# Patient Record
Sex: Male | Born: 1965
Health system: Southern US, Community
[De-identification: ages and names within clinical notes are randomized; demographics above are authoritative.]

## PROBLEM LIST (undated history)

## (undated) DIAGNOSIS — L570 Actinic keratosis: Secondary | ICD-10-CM

## (undated) DIAGNOSIS — I10 Essential (primary) hypertension: Secondary | ICD-10-CM

## (undated) DIAGNOSIS — M109 Gout, unspecified: Secondary | ICD-10-CM

## (undated) HISTORY — DX: Essential (primary) hypertension: I10

## (undated) HISTORY — DX: Gout, unspecified: M10.9

## (undated) HISTORY — DX: Actinic keratosis: L57.0

---

## 2014-12-01 ENCOUNTER — Emergency Department: Payer: 59

## 2014-12-01 ENCOUNTER — Emergency Department
Admission: EM | Admit: 2014-12-01 | Discharge: 2014-12-01 | Disposition: A | Discharge status: Home / Self Care | Payer: 59 | Attending: Emergency Medicine | Admitting: Emergency Medicine

## 2014-12-01 DIAGNOSIS — M79605 Pain in left leg: Secondary | ICD-10-CM

## 2014-12-01 DIAGNOSIS — Z791 Long term (current) use of non-steroidal anti-inflammatories (NSAID): Secondary | ICD-10-CM | POA: Diagnosis not present

## 2014-12-01 LAB — COMPREHENSIVE METABOLIC PANEL
ALBUMIN: 4.3 g/dL (ref 3.5–5.0)
ALT: 45 U/L (ref 17–63)
AST: 23 U/L (ref 15–41)
Alkaline Phosphatase: 28 U/L — ABNORMAL LOW (ref 38–126)
Anion gap: 7 (ref 5–15)
BUN: 12 mg/dL (ref 6–20)
CALCIUM: 9.5 mg/dL (ref 8.9–10.3)
CO2: 27 mmol/L (ref 22–32)
CREATININE: 1.1 mg/dL (ref 0.61–1.24)
Chloride: 103 mmol/L (ref 101–111)
GFR calc non Af Amer: >60 mL/min (ref >60)
Glucose, Bld: 139 mg/dL — ABNORMAL HIGH (ref 65–99)
Potassium: 4 mmol/L (ref 3.5–5.1)
SODIUM: 137 mmol/L (ref 135–145)
Total Bilirubin: 0.6 mg/dL (ref 0.3–1.2)
Total Protein: 7.3 g/dL (ref 6.5–8.1)

## 2014-12-01 LAB — CBC
HEMATOCRIT: 44.5 % (ref 40.0–52.0)
Hemoglobin: 14.7 g/dL (ref 13.0–18.0)
MCH: 28.7 pg (ref 26.0–34.0)
MCHC: 32.9 g/dL (ref 32.0–36.0)
MCV: 87.2 fL (ref 80.0–100.0)
PLATELETS: 225 10*3/uL (ref 150–440)
RBC: 5.11 MIL/uL (ref 4.40–5.90)
RDW: 14.7 % — ABNORMAL HIGH (ref 11.5–14.5)
WBC: 7.2 10*3/uL (ref 3.8–10.6)

## 2014-12-01 MED ORDER — NAPROXEN 500 MG PO TABS: 500.0000 mg | ORAL_TABLET | Freq: Two times a day (BID) | ORAL | Status: DC

## 2014-12-01 MED ORDER — HYDROCODONE-ACETAMINOPHEN 5-325 MG PO TABS: 1.0000 | ORAL_TABLET | ORAL | Status: DC | PRN

## 2014-12-01 NOTE — Discharge Instructions (Signed)
Moist heat to leg and elevate if any swelling.  Return to ER if any severe worsening or urgent concerns

## 2014-12-01 NOTE — ED Provider Notes (Signed)
[facility] Emergency Department Provider Note  ____________________________________________  Time seen: 1326  I have reviewed the triage vital signs and the nursing notes.   HISTORY  Chief Complaint Leg Pain   HPI Alan English is a 49 y.o. male is here today for evaluation of left calf pain. He states that he called his PCP at [facility] in [place] and was told to go to the emergency room for evaluation of a blood clot. On Saturday he noticed some pain after returning from [place]. He states that several times during the flight he did get up and walk. He has never had a history of blood clots in the past. Yesterday he slept most of the day but this morning and noticed that the pain was still there. He has not taken any medication over-the-counter for this. He describes his pain as a 1 or 2 out of 10 and more like a "charley horse". He denies any shortness of breath, chest pain, fever or chills. There is been no redness or warmth   No past medical history on file.  There are no active problems to display for this patient.   No past surgical history on file.  Current Outpatient Rx  Name  Route  Sig  Dispense  Refill  . HYDROcodone-acetaminophen (NORCO/VICODIN) 5-325 MG per tablet   Oral   Take 1 tablet by mouth every 4 (four) hours as needed for moderate pain.   20 tablet   0   . naproxen (NAPROSYN) 500 MG tablet   Oral   Take 1 tablet (500 mg total) by mouth 2 (two) times daily with a meal.   30 tablet   0     Allergies Review of patient's allergies indicates no known allergies.  No family history on file.  Social History History  Substance Use Topics  . Smoking status: Not on file  . Smokeless tobacco: Not on file  . Alcohol Use: Not on file    Review of Systems Constitutional: No fever/chills Eyes: No visual changes. ENT: No sore throat. Cardiovascular: Denies chest pain. Respiratory: Denies shortness of breath. Gastrointestinal:  No abdominal pain.  No nausea, no vomiting.  Musculoskeletal: Negative for back pain. Skin: Negative for rash. No redness or warmth left leg. Neurological: Negative for headaches, focal weakness or numbness. 10-point ROS otherwise negative.  ____________________________________________   PHYSICAL EXAM:  VITAL SIGNS: ED Triage Vitals  Enc Vitals Group     BP 12/01/14 1129 191/96 mmHg     Pulse Rate 12/01/14 1129 70     Resp 12/01/14 1129 20     Temp 12/01/14 1129 97.5 F (36.4 C)     Temp Source 12/01/14 1129 Oral     SpO2 12/01/14 1129 100 %     Weight 12/01/14 1129 300 lb (136.079 kg)     Height 12/01/14 1129 6\' 1"  (1.854 m)     Head Cir --      Peak Flow --      Pain Score 12/01/14 1129 2     Pain Loc --      Pain Edu? --      Excl. in [place]? --     Constitutional: Alert and oriented. Well appearing and in no acute distress. Eyes: Conjunctivae are normal. PERRL. EOMI. Head: Atraumatic. Nose: No congestion/rhinnorhea. Neck: No stridor.   Hematological/Lymphatic/Immunilogical: No cervical lymphadenopathy. Cardiovascular: Normal rate, regular rhythm. Grossly normal heart sounds.  Good peripheral circulation. Respiratory: Normal respiratory effort.  No retractions. Lungs CTAB.  Gastrointestinal: Soft and nontender. No distention. No abdominal bruits. Musculoskeletal: Left lower extremity tenderness without edema.  No joint effusions. No pedal edema noted.  Skin is warm, no redness, skin is intact. Homans sign is negative. Pulses are intact. Neurologic:  Normal speech and language. No gross focal neurologic deficits are appreciated. Speech is normal. No gait instability. Skin:  Skin is warm, dry and intact. No rash noted. Psychiatric: Mood and affect are normal. Speech and behavior are normal.  ____________________________________________   LABS (all labs ordered are listed, but only abnormal results are displayed)  Labs Reviewed  COMPREHENSIVE METABOLIC PANEL - Abnormal;  Notable for the following:    Glucose, Bld 139 (*)    Alkaline Phosphatase 28 (*)    All other components within normal limits  CBC - Abnormal; Notable for the following:    RDW 14.7 (*)    All other components within normal limits   ____________________________________________  EKG  Deferred ____________________________________________  RADIOLOGY  No evidence of DVT per radiologist on ultrasound. ____________________________________________   PROCEDURES  Procedure(s) performed: None  Critical Care performed: No  ____________________________________________   INITIAL IMPRESSION / ASSESSMENT AND PLAN / ED COURSE  Pertinent labs & imaging results that were available during my care of the patient were reviewed by me and considered in my medical decision making (see chart for details).  Patient was reassured that this was not a blood clot in his left leg. He was prescribed naproxen for inflammation and Norco as needed for pain. He is to use warm compresses to the area and elevate if needed for swelling. He is also going to follow up with his primary care physician if any continued problems. He is return to the emergency room if any severe worsening or urgent concerns. ____________________________________________   FINAL CLINICAL IMPRESSION(S) / ED DIAGNOSES  Final diagnoses:  Acute leg pain, left      Johnn Hai, PA-C 12/01/14 [place], MD 12/02/14 [phone]

## 2014-12-01 NOTE — ED Notes (Signed)
Patient recently returned from [place] on last Saturday and began having left lower leg pain.  Denies swelling or redness to left leg.  PCP sent for evaluation of possible blood clot.  Denies SHOB or CP.

## 2015-09-29 IMAGING — US US EXTREM LOW VENOUS*L*
1 series · 13 of 24 positions shown · non-contrast
Comparison: None.

CLINICAL DATA: Acute left lower extremity pain.



[Series 1: us extrem low venous*left* · 0.12mm/px · 13 of 44 slices shown]
[im 1/44]
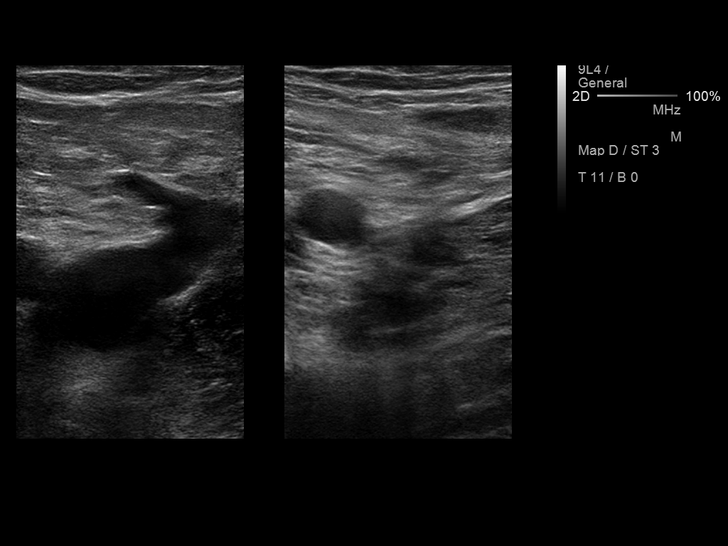
[im 4/44]
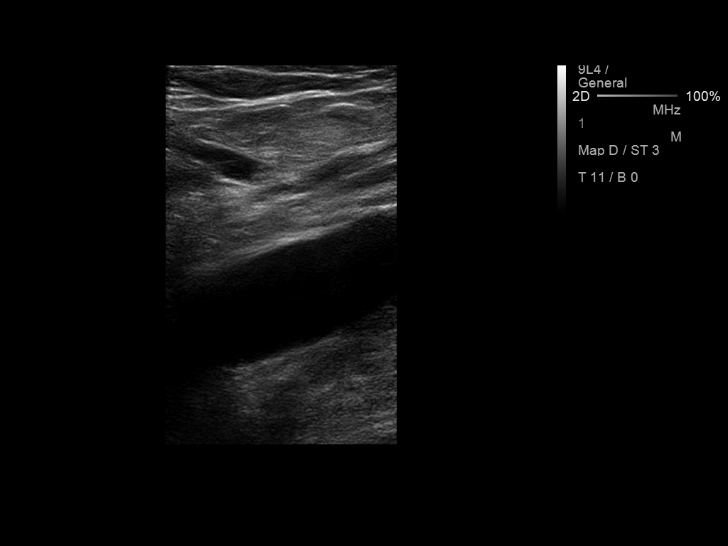
[im 8/44]
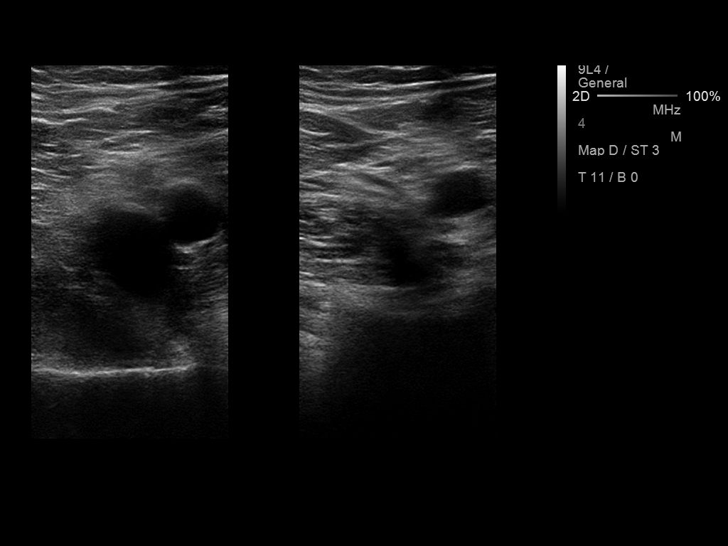
[im 12/44]
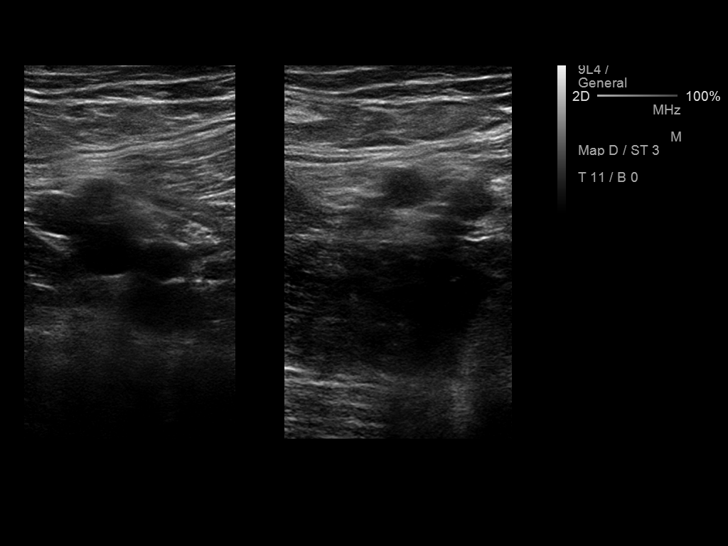
[im 15/44]
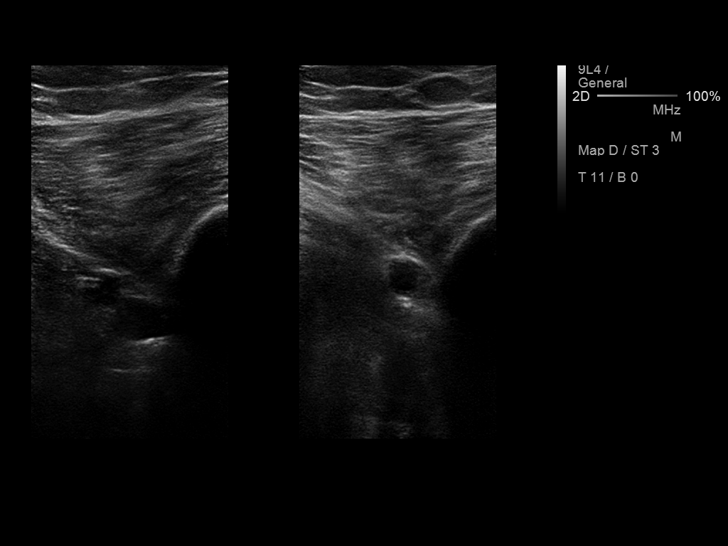
[im 19/44]
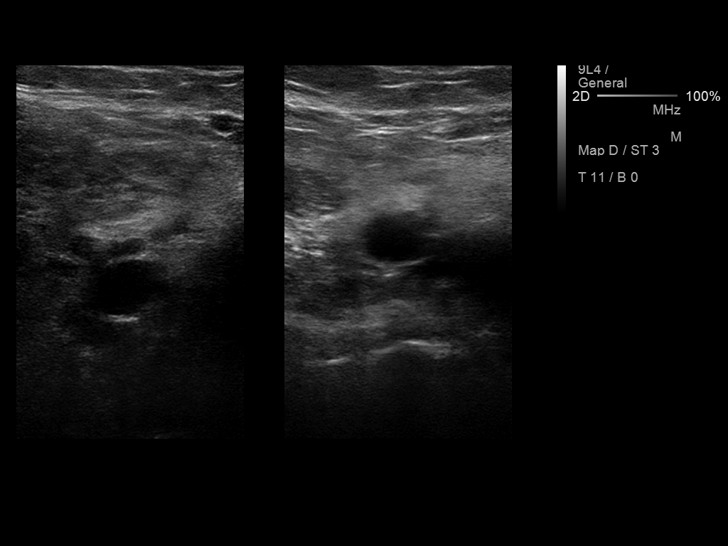
[im 23/44]
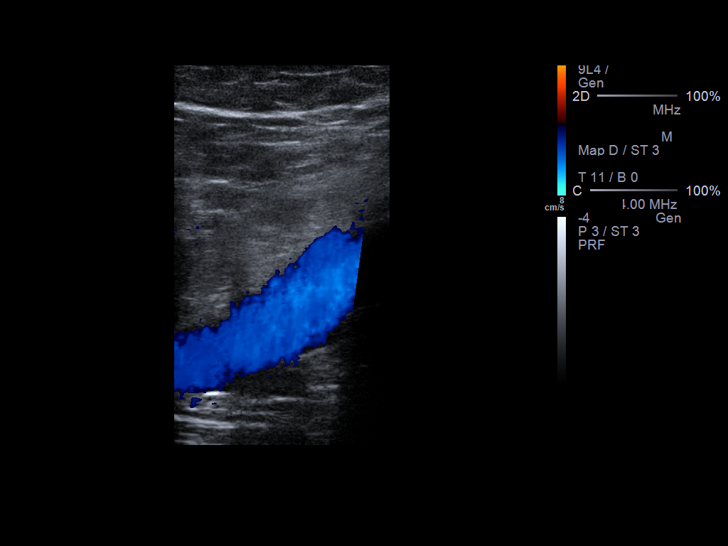
[im 25/44]
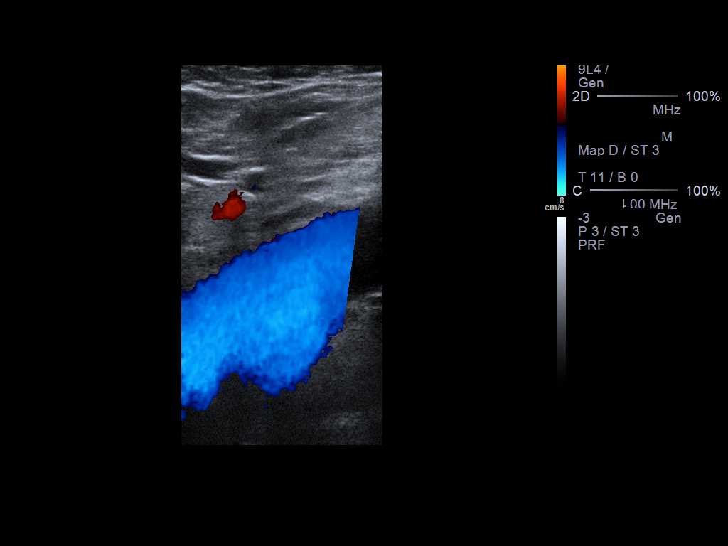
[im 29/44]
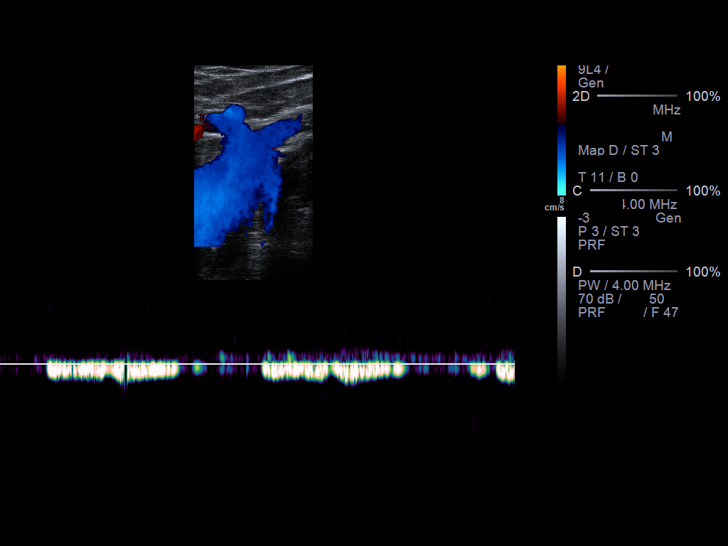
[im 32/44]
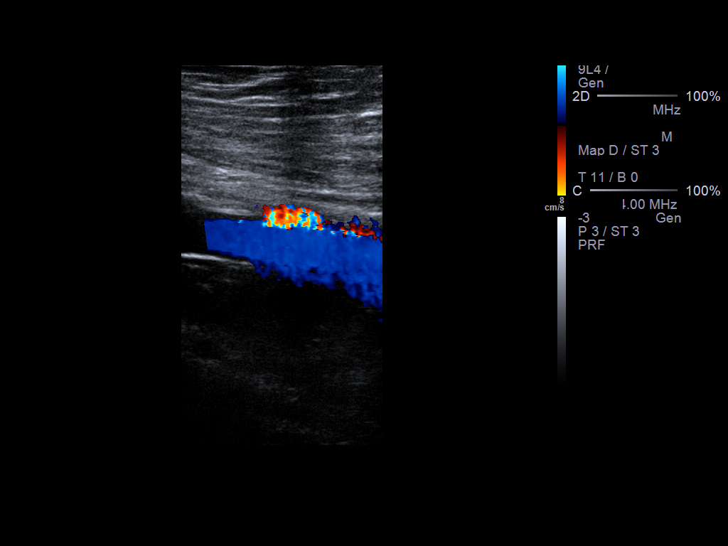
[im 36/44]
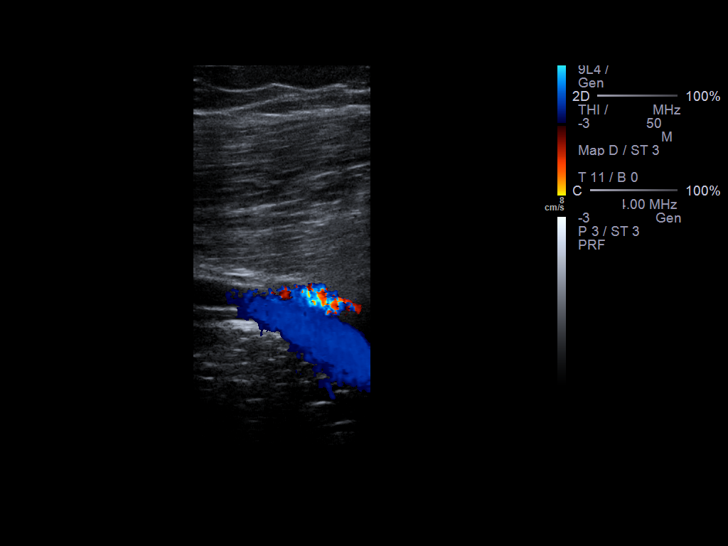
[im 40/44]
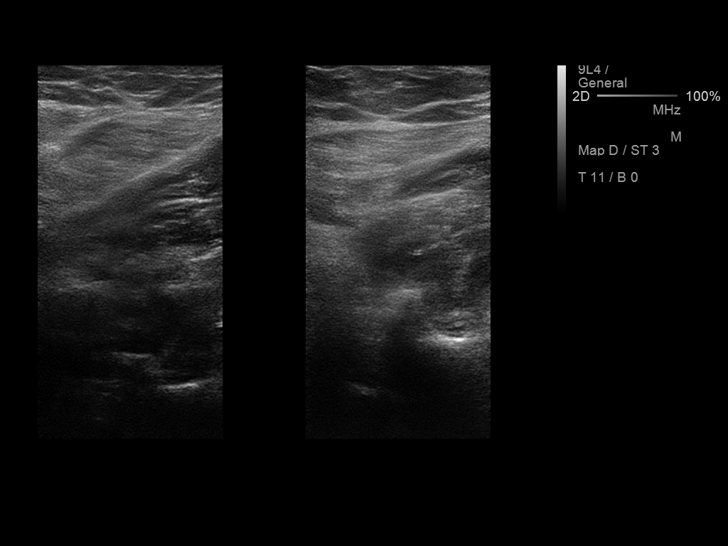
[im 44/44]
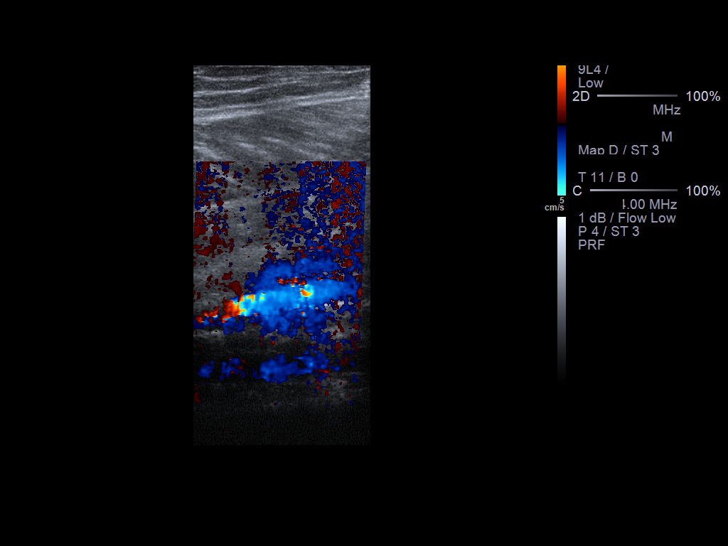

[13 of 24 positions shown; findings below may reference images not displayed]

FINDINGS: Contralateral Common Femoral Vein: Respiratory phasicity is normal
and symmetric with the symptomatic side. No evidence of thrombus.
Normal compressibility.

Common Femoral Vein: No evidence of thrombus. Normal
compressibility, respiratory phasicity and response to augmentation.

Saphenofemoral Junction: No evidence of thrombus. Normal
compressibility and flow on color Doppler imaging.

Profunda Femoral Vein: No evidence of thrombus. Normal
compressibility and flow on color Doppler imaging.

Femoral Vein: No evidence of thrombus. Normal compressibility,
respiratory phasicity and response to augmentation.

Popliteal Vein: No evidence of thrombus. Normal compressibility,
respiratory phasicity and response to augmentation.

Calf Veins: No evidence of thrombus. Normal compressibility and flow
on color Doppler imaging.

Superficial Great Saphenous Vein: No evidence of thrombus. Normal
compressibility and flow on color Doppler imaging.

Venous Reflux:  None.

Other Findings:  None.
IMPRESSION: No evidence of deep venous thrombosis seen in left lower extremity.

## 2019-12-03 DIAGNOSIS — S62634A Displaced fracture of distal phalanx of right ring finger, initial encounter for closed fracture: Secondary | ICD-10-CM | POA: Diagnosis not present

## 2019-12-03 DIAGNOSIS — S67192A Crushing injury of right middle finger, initial encounter: Secondary | ICD-10-CM | POA: Diagnosis not present

## 2019-12-03 DIAGNOSIS — M20011 Mallet finger of right finger(s): Secondary | ICD-10-CM | POA: Diagnosis not present

## 2021-01-28 ENCOUNTER — Ambulatory Visit: Payer: 59 | Admitting: Family Medicine

## 2021-02-01 ENCOUNTER — Other Ambulatory Visit: Payer: Self-pay | Admitting: Family Medicine

## 2021-02-01 ENCOUNTER — Ambulatory Visit (INDEPENDENT_AMBULATORY_CARE_PROVIDER_SITE_OTHER): Payer: BC Managed Care – PPO | Admitting: Family Medicine

## 2021-02-01 ENCOUNTER — Encounter: Payer: Self-pay | Admitting: Family Medicine

## 2021-02-01 ENCOUNTER — Other Ambulatory Visit: Payer: Self-pay

## 2021-02-01 VITALS — BP 162/94 | HR 78 | Ht 73.0 in | Wt 319.8 lb

## 2021-02-01 DIAGNOSIS — R7309 Other abnormal glucose: Secondary | ICD-10-CM

## 2021-02-01 DIAGNOSIS — M1A9XX0 Chronic gout, unspecified, without tophus (tophi): Secondary | ICD-10-CM

## 2021-02-01 DIAGNOSIS — Z0000 Encounter for general adult medical examination without abnormal findings: Secondary | ICD-10-CM

## 2021-02-01 DIAGNOSIS — Z8619 Personal history of other infectious and parasitic diseases: Secondary | ICD-10-CM

## 2021-02-01 DIAGNOSIS — I10 Essential (primary) hypertension: Secondary | ICD-10-CM

## 2021-02-01 DIAGNOSIS — Z6841 Body Mass Index (BMI) 40.0 and over, adult: Secondary | ICD-10-CM

## 2021-02-01 DIAGNOSIS — Z7689 Persons encountering health services in other specified circumstances: Secondary | ICD-10-CM

## 2021-02-01 DIAGNOSIS — Z1159 Encounter for screening for other viral diseases: Secondary | ICD-10-CM

## 2021-02-01 DIAGNOSIS — Z125 Encounter for screening for malignant neoplasm of prostate: Secondary | ICD-10-CM

## 2021-02-01 DIAGNOSIS — R011 Cardiac murmur, unspecified: Secondary | ICD-10-CM

## 2021-02-01 MED ORDER — COLCHICINE 0.6 MG PO TABS: ORAL_TABLET | ORAL | 1 refills | Status: DC

## 2021-02-01 MED ORDER — AMLODIPINE BESYLATE 10 MG PO TABS: 10.0000 mg | ORAL_TABLET | Freq: Every day | ORAL | 1 refills | Status: DC

## 2021-02-01 NOTE — Patient Instructions (Addendum)
Thank you for coming to the office today.  Start Amlodipine 48m daily. Caution mild swelling of ankle/foot, can elevate if need.  Start Colchicine PRN flare if needed as discussed.  Omron (brand) arm automatic BP cuff. Keep a log. Goal < 140/90.  Colon Cancer Screening: - For all adults age 55+routine colon cancer screening is highly recommended.     - Recent guidelines from [place] starting age of 421- Early detection of colon cancer is important, because often there are no warning signs or symptoms, also if found early usually it can be cured. Late stage is hard to treat.  - If you are not interested in Colonoscopy screening (if done and normal you could be cleared for 5 to 10 years until next due), then Cologuard is an excellent alternative for screening test for Colon Cancer. It is highly sensitive for detecting DNA of colon cancer from even the earliest stages. Also, there is NO bowel prep required. - If Cologuard is NEGATIVE, then it is good for 3 years before next due - If Cologuard is POSITIVE, then it is strongly advised to get a Colonoscopy, which allows the GI doctor to locate the source of the cancer or polyp (even very early stage) and treat it by removing it. ------------------------- If you would like to proceed with Cologuard (stool DNA test) - FIRST, call your insurance company and tell them you want to check cost of Cologuard tell them CPT Code [phone](it may be completely covered and you could get for no cost, OR max cost without any coverage is about $600). Also, keep in mind if you do NOT open the kit, and decide not to do the test, you will NOT be charged, you should contact the company if you decide not to do the test. - If you want to proceed, you can notify [place](phone message, [place] or at next visit) and we will order it for you. The test kit will be delivered to you house within about 1 week. Follow instructions to collect sample, you  may call the company for any help or questions, 24/7 telephone support at [phone]   ---------------------------  DUE for FASTING BLOOD WORK (no food or drink after midnight before the lab appointment, only water or coffee without cream/sugar on the morning of)  SCHEDULE "Lab Only" visit in the morning at the clinic for lab draw in [place]  - Make sure Lab Only appointment is at about 1 week before your next appointment, so that results will be available  For Lab Results, once available within 2-3 days of blood draw, you can can log in to MyChart online to view your results and a brief explanation. Also, we can discuss results at next follow-up visit.    Please schedule a Follow-up Appointment to: Return in about 4 weeks (around 03/01/2021) for 4 weeks fasting lab only then 1 week later Annual Physical.  If you have any other questions or concerns, please feel free to call the office or send a message through [place] [place] You may also schedule an earlier appointment if necessary.  Additionally, you may be receiving a survey about your experience at our office within a few days to 1 week by e-mail or mail. We value your feedback.  ANobie Putnam DO [facility]

## 2021-02-01 NOTE — Telephone Encounter (Signed)
   Notes to clinic:  Patient requests 90 days supply  Requested Prescriptions  Pending Prescriptions Disp Refills   colchicine 0.6 MG tablet [Pharmacy Med Name: COLCHICINE 0.6MG  TABLETS] 90 tablet     Sig: TAKE 1 TABLET BY MOUTH AS DIRECTED, MAY REPEAT DOSE ON DAY 1, THEN TAKE 1 TABLET DAILY FOR UP TO 7 TO 10 DAYS AS NEEDED FOR GOUT FLARE UNTIL RESOLVED      Endocrinology:  Gout Agents Failed - 02/01/2021 11:24 AM      Failed - Uric Acid in normal range and within 360 days    No results found for: POCURA, LABURIC        Failed - Cr in normal range and within 360 days    Creatinine, Ser  Date Value Ref Range Status  12/01/2014 1.10 0.61 - 1.24 mg/dL Final          Passed - Valid encounter within last 12 months    Recent Outpatient Visits           Today Chronic gout involving toe of right foot without tophus, unspecified cause   [place], DO       Future Appointments             In 1 month [facility], [facility]

## 2021-02-01 NOTE — Progress Notes (Signed)
Subjective:    Patient ID: Alan English, male    DOB: April 03, 1966, 55 y.o.   MRN: [id number]  Alan English is a 55 y.o. male presenting on 02/01/2021 for Establish Care and Hypertension  No prior PCP >10 years.  HPI  CHRONIC HTN: Reports recent 1 month ago he attempted to donate blood and he had elevated blood pressure and they asked him to see PCP. He saw Dentist several weeks ago and had elevated. - He does not have a BP cuff at home. Current Meds - None, never on med   Lifestyle: - Diet: Goal to improve - Exercise: Goal to improve Denies CP, dyspnea, HA, edema, dizziness / lightheadedness  Gout Chronic problem Previously on Allopurinol, limited success. He had same number of flares and frequency in past while on med. He asks about PRN medication for flare. Often 4 times a year approximately, R big toe / ankle. Due for uric acid in future. Check on lab. He was not sure what dose on Allopurinol, thinks it was 100mg  only.  Health Maintenance:  Colon CA Screening: Never had colonoscopy or screening. Currently asymptomatic. No known family history of colon CA. Due for screening test considering options. Interested in colonscopy  COVID Vaccine  1st 2 doses, and Booster #1  Unsure if he has chicken pox. Will inquire.   Depression screen [facility] 2/9 02/01/2021  Decreased Interest 0  Down, Depressed, Hopeless 0  PHQ - 2 Score 0  Altered sleeping 0  Tired, decreased energy 0  Change in appetite 0  Feeling bad or failure about yourself  0  Trouble concentrating 0  Moving slowly or fidgety/restless 0  Suicidal thoughts 0  PHQ-9 Score 0  Difficult doing work/chores Not difficult at all   GAD 7 : Generalized Anxiety Score 02/01/2021  Nervous, Anxious, on Edge 0  Control/stop worrying 0  Worry too much - different things 0  Trouble relaxing 0  Restless 0  Easily annoyed or irritable 0  Afraid - awful might happen 0  Total GAD 7 Score 0  Anxiety Difficulty Not difficult at  all      Past Medical History:  Diagnosis Date   Gout    Hypertension    History reviewed. No pertinent surgical history. Social History   Socioeconomic History   Marital status: Married    Spouse name: Not on file   Number of children: Not on file   Years of education: Not on file   Highest education level: Not on file  Occupational History   Not on file  Tobacco Use   Smoking status: Never   Smokeless tobacco: Never  Vaping Use   Vaping Use: Never used  Substance and Sexual Activity   Alcohol use: Yes    Alcohol/week: 1.0 standard drink    Types: 1 Standard drinks or equivalent per week   Drug use: Not on file   Sexual activity: Not on file  Other Topics Concern   Not on file  Social History Narrative   Not on file   Social Determinants of Health   Financial Resource Strain: Not on file  Food Insecurity: Not on file  Transportation Needs: Not on file  Physical Activity: Not on file  Stress: Not on file  Social Connections: Not on file  Intimate Partner Violence: Not on file   Family History  Problem Relation Age of Onset   Heart attack Maternal Grandfather 57   Heart attack Paternal Grandfather 51   Current Outpatient Medications  on File Prior to Visit  Medication Sig   multivitamin-iron-minerals-folic acid (CENTRUM) chewable tablet Chew 1 tablet by mouth daily.   No current facility-administered medications on file prior to visit.    Review of Systems Per HPI unless specifically indicated above     Objective:    BP (!) 162/94 (BP Location: Left Arm, Cuff Size: Large)   Pulse 78   Ht 6\' 1"  (1.854 m)   Wt (!) 319 lb 12.8 oz (145.1 kg)   SpO2 100%   BMI 42.19 kg/m   Wt Readings from Last 3 Encounters:  02/01/21 (!) 319 lb 12.8 oz (145.1 kg)  12/01/14 300 lb (136.1 kg)    Physical Exam Vitals and nursing note reviewed.  Constitutional:      General: He is not in acute distress.    Appearance: Normal appearance. He is well-developed. He is  not diaphoretic.     Comments: Well-appearing, comfortable, cooperative  HENT:     Head: Normocephalic and atraumatic.  Eyes:     General:        Right eye: No discharge.        Left eye: No discharge.     Conjunctiva/sclera: Conjunctivae normal.  Cardiovascular:     Rate and Rhythm: Normal rate and regular rhythm.     Pulses: Normal pulses.     Heart sounds: Murmur (mild 1/6 systolic) heard.  Pulmonary:     Effort: Pulmonary effort is normal.  Skin:    General: Skin is warm and dry.     Findings: No erythema or rash.  Neurological:     Mental Status: He is alert and oriented to person, place, and time.  Psychiatric:        Mood and Affect: Mood normal.        Behavior: Behavior normal.        Thought Content: Thought content normal.     Comments: Well groomed, good eye contact, normal speech and thoughts      Results for orders placed or performed during the hospital encounter of 12/01/14  Comprehensive metabolic panel  Result Value Ref Range   Sodium 137 135 - 145 mmol/L   Potassium 4.0 3.5 - 5.1 mmol/L   Chloride 103 101 - 111 mmol/L   CO2 27 22 - 32 mmol/L   Glucose, Bld 139 (H) 65 - 99 mg/dL   BUN 12 6 - 20 mg/dL   Creatinine, Ser 1.10 0.61 - 1.24 mg/dL   Calcium 9.5 8.9 - 10.3 mg/dL   Total Protein 7.3 6.5 - 8.1 g/dL   Albumin 4.3 3.5 - 5.0 g/dL   AST 23 15 - 41 U/L   ALT 45 17 - 63 U/L   Alkaline Phosphatase 28 (L) 38 - 126 U/L   Total Bilirubin 0.6 0.3 - 1.2 mg/dL   GFR calc non Af Amer >60 >60 mL/min   GFR calc Af Amer >60 >60 mL/min   Anion gap 7 5 - 15  CBC  Result Value Ref Range   WBC 7.2 3.8 - 10.6 K/uL   RBC 5.11 4.40 - 5.90 MIL/uL   Hemoglobin 14.7 13.0 - 18.0 g/dL   HCT 44.5 40.0 - 52.0 %   MCV 87.2 80.0 - 100.0 fL   MCH 28.7 26.0 - 34.0 pg   MCHC 32.9 32.0 - 36.0 g/dL   RDW 14.7 (H) 11.5 - 14.5 %   Platelets 225 150 - 440 K/uL      Assessment & Plan:  Problem List Items Addressed This Visit     Morbid obesity with BMI of 40.0-44.9,  adult ([place])   Heart murmur   Essential hypertension    Elevated BP, repeat check improved manually - Home BP readings none  No known complications   Plan:  1. START Amlodipine 10mg  daily - counsel on potential risk side effect benefits 2. Encourage improved lifestyle - low sodium diet, regular exercise 3. Start monitor BP outside office, bring readings to next visit, if persistently >140/90 or new symptoms notify office sooner  F/u 4 weeks labs       Relevant Medications   amLODipine (NORVASC) 10 MG tablet   Chronic gout involving toe of right foot without tophus - Primary   Relevant Medications   colchicine 0.6 MG tablet   Other Visit Diagnoses     Encounter to establish care with new doctor           Establish care, review prior PCP record.  Chronic Gout Prior history 4 x flare year, was on allopurinol 100mg  suspect ineffective dose. Will re order Colchicine PRN flares only Check uric acid level with upcoming labs Reconsider dose titration Allopurinol for prevention    Meds ordered this encounter  Medications   colchicine 0.6 MG tablet    Sig: Take 1 (0.6) tab as needed for gout flare, may repeat dose Day 1. Then take 1 tab (0.6) daily for up to 7-10 days as needed for gout flare until resolved.    Dispense:  30 tablet    Refill:  1   amLODipine (NORVASC) 10 MG tablet    Sig: Take 1 tablet (10 mg total) by mouth daily.    Dispense:  90 tablet    Refill:  1     Follow up plan: Return in about 4 weeks (around 03/01/2021) for 4 weeks fasting lab only then 1 week later Annual Physical.  Future labs.  CMET, Lipid, CBC, A1c, PSA, Hep C, Uric Acid, Chicken Pox Titer  Nobie Putnam, DO [facility] 02/01/2021, 10:14 AM

## 2021-02-01 NOTE — Assessment & Plan Note (Signed)
Elevated BP, repeat check improved manually - Home BP readings none  No known complications   Plan:  1. START Amlodipine 10mg  daily - counsel on potential risk side effect benefits 2. Encourage improved lifestyle - low sodium diet, regular exercise 3. Start monitor BP outside office, bring readings to next visit, if persistently >140/90 or new symptoms notify office sooner  F/u 4 weeks labs

## 2021-02-24 ENCOUNTER — Other Ambulatory Visit: Payer: Self-pay

## 2021-02-24 DIAGNOSIS — I10 Essential (primary) hypertension: Secondary | ICD-10-CM

## 2021-02-24 DIAGNOSIS — Z0000 Encounter for general adult medical examination without abnormal findings: Secondary | ICD-10-CM

## 2021-02-24 DIAGNOSIS — Z1159 Encounter for screening for other viral diseases: Secondary | ICD-10-CM

## 2021-02-24 DIAGNOSIS — M1A9XX0 Chronic gout, unspecified, without tophus (tophi): Secondary | ICD-10-CM

## 2021-02-24 DIAGNOSIS — R7309 Other abnormal glucose: Secondary | ICD-10-CM

## 2021-02-24 DIAGNOSIS — Z125 Encounter for screening for malignant neoplasm of prostate: Secondary | ICD-10-CM

## 2021-02-24 DIAGNOSIS — Z8619 Personal history of other infectious and parasitic diseases: Secondary | ICD-10-CM

## 2021-02-25 ENCOUNTER — Other Ambulatory Visit: Payer: BC Managed Care – PPO

## 2021-02-25 ENCOUNTER — Other Ambulatory Visit: Payer: Self-pay

## 2021-02-25 DIAGNOSIS — Z125 Encounter for screening for malignant neoplasm of prostate: Secondary | ICD-10-CM | POA: Diagnosis not present

## 2021-02-25 DIAGNOSIS — Z1159 Encounter for screening for other viral diseases: Secondary | ICD-10-CM | POA: Diagnosis not present

## 2021-02-25 DIAGNOSIS — I10 Essential (primary) hypertension: Secondary | ICD-10-CM | POA: Diagnosis not present

## 2021-02-25 DIAGNOSIS — R7309 Other abnormal glucose: Secondary | ICD-10-CM | POA: Diagnosis not present

## 2021-02-25 DIAGNOSIS — Z8619 Personal history of other infectious and parasitic diseases: Secondary | ICD-10-CM | POA: Diagnosis not present

## 2021-02-25 DIAGNOSIS — M1A9XX0 Chronic gout, unspecified, without tophus (tophi): Secondary | ICD-10-CM | POA: Diagnosis not present

## 2021-02-26 LAB — CBC WITH DIFFERENTIAL/PLATELET
Absolute Monocytes: 609 cells/uL (ref 200–950)
Basophils Absolute: 52 cells/uL (ref 0–200)
Basophils Relative: 0.9 %
Eosinophils Absolute: 168 cells/uL (ref 15–500)
Eosinophils Relative: 2.9 %
HCT: 45.5 % (ref 38.5–50.0)
Hemoglobin: 14.6 g/dL (ref 13.2–17.1)
Lymphs Abs: 1943 cells/uL (ref 850–3900)
MCH: 28.7 pg (ref 27.0–33.0)
MCHC: 32.1 g/dL (ref 32.0–36.0)
MCV: 89.6 fL (ref 80.0–100.0)
MPV: 10.1 fL (ref 7.5–12.5)
Monocytes Relative: 10.5 %
Neutro Abs: 3028 cells/uL (ref 1500–7800)
Neutrophils Relative %: 52.2 %
Platelets: 263 10*3/uL (ref 140–400)
RBC: 5.08 10*6/uL (ref 4.20–5.80)
RDW: 12.6 % (ref 11.0–15.0)
Total Lymphocyte: 33.5 %
WBC: 5.8 10*3/uL (ref 3.8–10.8)

## 2021-02-26 LAB — HEPATITIS C ANTIBODY
Hepatitis C Ab: NONREACTIVE
SIGNAL TO CUT-OFF: 0.07 (ref <1.00)

## 2021-02-26 LAB — HEMOGLOBIN A1C
Hgb A1c MFr Bld: 7.6 % of total Hgb — ABNORMAL HIGH (ref <5.7)
Mean Plasma Glucose: 171 mg/dL
eAG (mmol/L): 9.5 mmol/L

## 2021-02-26 LAB — COMPLETE METABOLIC PANEL WITH GFR
AG Ratio: 1.6 (calc) (ref 1.0–2.5)
ALT: 59 U/L — ABNORMAL HIGH (ref 9–46)
AST: 29 U/L (ref 10–35)
Albumin: 4.4 g/dL (ref 3.6–5.1)
Alkaline phosphatase (APISO): 27 U/L — ABNORMAL LOW (ref 35–144)
BUN: 12 mg/dL (ref 7–25)
CO2: 30 mmol/L (ref 20–32)
Calcium: 10.2 mg/dL (ref 8.6–10.3)
Chloride: 101 mmol/L (ref 98–110)
Creat: 1.14 mg/dL (ref 0.70–1.30)
Globulin: 2.7 g/dL (calc) (ref 1.9–3.7)
Glucose, Bld: 159 mg/dL — ABNORMAL HIGH (ref 65–99)
Potassium: 4.8 mmol/L (ref 3.5–5.3)
Sodium: 139 mmol/L (ref 135–146)
Total Bilirubin: 0.5 mg/dL (ref 0.2–1.2)
Total Protein: 7.1 g/dL (ref 6.1–8.1)
eGFR: 76 mL/min/{1.73_m2} (ref >=60)

## 2021-02-26 LAB — LIPID PANEL
Cholesterol: 199 mg/dL (ref <200)
HDL: 37 mg/dL — ABNORMAL LOW (ref >=40)
LDL Cholesterol (Calc): 131 mg/dL (calc) — ABNORMAL HIGH
Non-HDL Cholesterol (Calc): 162 mg/dL (calc) — ABNORMAL HIGH (ref <130)
Total CHOL/HDL Ratio: 5.4 (calc) — ABNORMAL HIGH (ref <5.0)
Triglycerides: 168 mg/dL — ABNORMAL HIGH (ref <150)

## 2021-02-26 LAB — VARICELLA ZOSTER ANTIBODY, IGG: Varicella IgG: 2170 index

## 2021-02-26 LAB — PSA: PSA: 0.16 ng/mL (ref <=4.00)

## 2021-02-26 LAB — URIC ACID: Uric Acid, Serum: 7.8 mg/dL (ref 4.0–8.0)

## 2021-03-04 ENCOUNTER — Other Ambulatory Visit: Payer: Self-pay | Admitting: Family Medicine

## 2021-03-04 ENCOUNTER — Encounter: Payer: Self-pay | Admitting: Family Medicine

## 2021-03-04 ENCOUNTER — Other Ambulatory Visit: Payer: Self-pay

## 2021-03-04 ENCOUNTER — Ambulatory Visit (INDEPENDENT_AMBULATORY_CARE_PROVIDER_SITE_OTHER): Payer: BC Managed Care – PPO | Admitting: Family Medicine

## 2021-03-04 VITALS — BP 159/96 | HR 79 | Ht 72.0 in | Wt 318.8 lb

## 2021-03-04 DIAGNOSIS — Z1211 Encounter for screening for malignant neoplasm of colon: Secondary | ICD-10-CM | POA: Diagnosis not present

## 2021-03-04 DIAGNOSIS — Z0000 Encounter for general adult medical examination without abnormal findings: Secondary | ICD-10-CM | POA: Diagnosis not present

## 2021-03-04 DIAGNOSIS — I10 Essential (primary) hypertension: Secondary | ICD-10-CM | POA: Diagnosis not present

## 2021-03-04 DIAGNOSIS — R7309 Other abnormal glucose: Secondary | ICD-10-CM

## 2021-03-04 DIAGNOSIS — Z6841 Body Mass Index (BMI) 40.0 and over, adult: Secondary | ICD-10-CM

## 2021-03-04 DIAGNOSIS — E782 Mixed hyperlipidemia: Secondary | ICD-10-CM

## 2021-03-04 MED ORDER — OLMESARTAN MEDOXOMIL 40 MG PO TABS: 40.0000 mg | ORAL_TABLET | Freq: Every day | ORAL | 1 refills | Status: DC

## 2021-03-04 NOTE — Patient Instructions (Addendum)
Thank you for coming to the office today.  Referral sent for Colonoscopy - stay tuned for apt  [facility]) [place], [place] [address] Phone: [phone]  Start Olmesartan '40mg'$  pill, can start with HALF for '20mg'$  daily with Amlodipine. If BP not controlled still will double for whole dose '40mg'$  one pill daily.  Eat at least 3 meals and 1-2 snacks per day (don't skip breakfast).  Aim for no more than 5 hours between eating. - Tip: If you go >5 hours without eating and become very hungry, your body will supply it's own resources temporarily and you can gain extra weight when you eat.   The 5 Minute Rule of Exercise - Promise yourself to at least do 5 minutes of exercise (make sure you time it), and if at the end of 5 minutes (this is the hardest part of the work-out), if you still feel like you want stop (or not motivated to continue) then allow yourself to stop. Otherwise, more often than not you will feel encouraged that you can continue for a little while longer or even more!   Diet Recommendations for Preventing Diabetes   Preventing Starchy (carb) foods include: Bread, rice, pasta, potatoes, corn, crackers, bagels, muffins, all baked goods.   Protein foods include: Meat, fish, poultry, eggs, dairy foods, and beans such as pinto and kidney beans (beans also provide carbohydrate).   1. Eat at least 3 meals and 1-2 snacks per day. Never go more than 4-5 hours while awake without eating.   2. Limit starchy foods to TWO per meal and ONE per snack. ONE portion of a starchy  food is equal to the following:   - ONE slice of bread (or its equivalent, such as half of a hamburger bun).   - 1/2 cup of a "scoopable" starchy food such as potatoes or rice.   - 1 OUNCE (28 grams) of starchy snacks (crackers or pretzels, look on label).   - 15 grams of carbohydrate as shown on food label.   3. Both lunch and dinner should include a protein  food, a carb food, and vegetables.   - Obtain twice as many veg's as protein or carbohydrate foods for both lunch and dinner.   - Try to keep frozen veg's on hand for a quick vegetable serving.     - Fresh or frozen veg's are best.   4. Breakfast should always include protein.     DUE for FASTING BLOOD WORK (no food or drink after midnight before the lab appointment, only water or coffee without cream/sugar on the morning of)  SCHEDULE "Lab Only" visit in the morning at the clinic for lab draw in 3 MONTHS   - Make sure Lab Only appointment is at about 1 week before your next appointment, so that results will be available  For Lab Results, once available within 2-3 days of blood draw, you can can log in to MyChart online to view your results and a brief explanation. Also, we can discuss results at next follow-up visit.   Please schedule a Follow-up Appointment to: Return in about 3 months (around 06/04/2021) for 3 month fasting lab Follow-up PreDM vs DM, HTN, lab results.  If you have any other questions or concerns, please feel free to call the office or send a message through [place]. You may also schedule an earlier appointment if necessary.  Additionally, you may be receiving a survey about your experience at our  office within a few days to 1 week by e-mail or mail. We value your feedback.  Nobie Putnam, DO [facility], [facility]  Heart-Healthy Eating Plan Many factors influence your heart (coronary) health, including eating and exercise habits. Coronary risk increases with abnormal blood fat (lipid) levels. Heart-healthy meal planning includes limiting unhealthy fats,increasing healthy fats, and making other diet and lifestyle changes. What is my plan? Your health care provider may recommend that you: Limit your fat intake to _________% or less of your total calories each day. Limit your saturated fat intake to _________% or less of your total calories each  day. Limit the amount of cholesterol in your diet to less than _________ mg per day. What are tips for following this plan? Cooking Cook foods using methods other than frying. Baking, boiling, grilling, and broiling are all good options. Other ways to reduce fat include: Removing the skin from poultry. Removing all visible fats from meats. Steaming vegetables in water or broth. Meal planning  At meals, imagine dividing your plate into fourths: Fill one-half of your plate with vegetables and green salads. Fill one-fourth of your plate with whole grains. Fill one-fourth of your plate with lean protein foods. Eat 4-5 servings of vegetables per day. One serving equals 1 cup raw or cooked vegetable, or 2 cups raw leafy greens. Eat 4-5 servings of fruit per day. One serving equals 1 medium whole fruit,  cup dried fruit,  cup fresh, frozen, or canned fruit, or  cup 100% fruit juice. Eat more foods that contain soluble fiber. Examples include apples, broccoli, carrots, beans, peas, and barley. Aim to get 25-30 g of fiber per day. Increase your consumption of legumes, nuts, and seeds to 4-5 servings per week. One serving of dried beans or legumes equals  cup cooked, 1 serving of nuts is  cup, and 1 serving of seeds equals 1 tablespoon.  Fats Choose healthy fats more often. Choose monounsaturated and polyunsaturated fats, such as olive and canola oils, flaxseeds, walnuts, almonds, and seeds. Eat more omega-3 fats. Choose salmon, mackerel, sardines, tuna, flaxseed oil, and ground flaxseeds. Aim to eat fish at least 2 times each week. Check food labels carefully to identify foods with trans fats or high amounts of saturated fat. Limit saturated fats. These are found in animal products, such as meats, butter, and cream. Plant sources of saturated fats include palm oil, palm kernel oil, and coconut oil. Avoid foods with partially hydrogenated oils in them. These contain trans fats. Examples are stick  margarine, some tub margarines, cookies, crackers, and other baked goods. Avoid fried foods. General information Eat more home-cooked food and less restaurant, buffet, and fast food. Limit or avoid alcohol. Limit foods that are high in starch and sugar. Lose weight if you are overweight. Losing just 5-10% of your body weight can help your overall health and prevent diseases such as diabetes and heart disease. Monitor your salt (sodium) intake, especially if you have high blood pressure. Talk with your health care provider about your sodium intake. Try to incorporate more vegetarian meals weekly. What foods can I eat? Fruits All fresh, canned (in natural juice), or frozen fruits. Vegetables Fresh or frozen vegetables (raw, steamed, roasted, or grilled). Green salads. Grains Most grains. Choose whole wheat and whole grains most of the time. Rice andpasta, including brown rice and pastas made with whole wheat. Meats and other proteins Lean, well-trimmed beef, veal, pork, and lamb. Chicken and [place] without skin. All fish and shellfish. Wild  duck, rabbit, pheasant, and venison. Egg whites or low-cholesterol egg substitutes. Dried beans, peas, lentils, and tofu. Seedsand most nuts. Dairy Low-fat or nonfat cheeses, including ricotta and mozzarella. Skim or 1% milk (liquid, powdered, or evaporated). Buttermilk made with low-fat milk. Nonfat orlow-fat yogurt. Fats and oils Non-hydrogenated (trans-free) margarines. Vegetable oils, including soybean, sesame, sunflower, olive, peanut, safflower, corn, canola, and cottonseed. Salad dressings or mayonnaisemade with a vegetable oil. Beverages Water (mineral or sparkling). Coffee and tea. Diet carbonated beverages. Sweets and desserts Sherbet, gelatin, and fruit ice. Small amounts of dark chocolate. Limit all sweets and desserts. Seasonings and condiments All seasonings and condiments. The items listed above may not be a complete list of foods and  beverages you can eat. Contact a dietitian for more options. What foods are not recommended? Fruits Canned fruit in heavy syrup. Fruit in cream or butter sauce. Fried fruit. Limitcoconut. Vegetables Vegetables cooked in cheese, cream, or butter sauce. Fried vegetables. Grains Breads made with saturated or trans fats, oils, or whole milk. Croissants. Sweet rolls. Donuts. High-fat crackers,such as cheese crackers. Meats and other proteins Fatty meats, such as hot dogs, ribs, sausage, bacon, rib-eye roast or steak. High-fat deli meats, such as salami and bologna. Caviar. Domestic duck andgoose. Organ meats, such as liver. Dairy Cream, sour cream, cream cheese, and creamed cottage cheese. Whole milk cheeses. Whole or 2% milk (liquid, evaporated, or condensed). Whole buttermilk.Cream sauce or high-fat cheese sauce. Whole-milk yogurt. Fats and oils Meat fat, or shortening. Cocoa butter, hydrogenated oils, palm oil, coconut oil, palm kernel oil. Solid fats and shortenings, including bacon fat, salt pork, lard, and butter. Nondairy cream substitutes. Salad dressings with cheeseor sour cream. Beverages Regular sodas and any drinks with added sugar. Sweets and desserts Frosting. Pudding. Cookies. Cakes. Pies. Milk chocolate or white chocolate.Buttered syrups. Full-fat ice cream or ice cream drinks. The items listed above may not be a complete list of foods and beverages to avoid. Contact a dietitian for more information. Summary Heart-healthy meal planning includes limiting unhealthy fats, increasing healthy fats, and making other diet and lifestyle changes. Lose weight if you are overweight. Losing just 5-10% of your body weight can help your overall health and prevent diseases such as diabetes and heart disease. Focus on eating a balance of foods, including fruits and vegetables, low-fat or nonfat dairy, lean protein, nuts and legumes, whole grains, and heart-healthy oils and fats. This information is  not intended to replace advice given to you by your health care provider. Make sure you discuss any questions you have with your healthcare provider. Document Revised: 08/11/2017 Document Reviewed: 08/11/2017 Elsevier Patient Education  2022 [employer].

## 2021-03-04 NOTE — Assessment & Plan Note (Addendum)
Still Elevated BP - Home BP readings elevated even on amlodipine [id number] No known complications    Plan:  1. ADD new med. ARB olmesartan '40mg'$  daily - can start with half tab for 1 week then up to '40mg'$  daily, counseling on potential risk of angioedema benefit risk. Check chemistry Cr K in 3 months. 2. CONTINUE Amlodipine '10mg'$  daily 2. Encourage improved lifestyle - low sodium diet, regular exercise 3. Keep monitor BP outside office, bring readings to next visit, if persistently >140/90 or new symptoms notify office sooner

## 2021-03-04 NOTE — Progress Notes (Signed)
Subjective:    Patient ID: Alan English, male    DOB: [birth year], 55 y.o.   MRN: [id number]  Alan English is a 55 y.o. male presenting on 03/04/2021 for Annual Exam   HPI  Here for Annual Physical and Lab Review.  CHRONIC HTN: Last visit started on Amlodipine $RemoveBefor'10mg'tUnePueAFxwq$  daily Elevated BP 150-190 / 80-90 at home. Current Meds - Amlodipine $RemoveBefo'10mg'OGMAihEhvTk$  daily Lifestyle: - Diet: Goal to improve - Exercise: Goal to improve Denies CP, dyspnea, HA, edema, dizziness / lightheadedness  HYPERLIPIDEMIA: - Reports no concerns. Last lipid panel 02/2021, LDL 131 Not on therapy Admits trying to do more high protein high fiber diet   Gout Chronic problem Previously on Allopurinol, limited success. He had same number of flares and frequency in past while on med. He asks about PRN medication for flare. Often 4 times a year approximately, R big toe / ankle. Uric acid within appropriate range  PreDiabetes / Elevated A1c Prior elevated sugar. Today here with A1c 7.6. No prior dx DM Unaware of this problem. Interested in lifestyle modification today Not on medication.    Health Maintenance:   Colon CA Screening: Never had colonoscopy or screening. Currently asymptomatic. No known family history of colon CA. Due for screening test considering options - referral sent today.  PSA 0.16 - negative 02/2021   COVID Vaccine  1st 2 doses, and Booster #1   Unsure if he has chicken pox. Will inquire. They do not believe he had chicken pox. He has current lab - Zoster antibody pending.  Flu Shot Due.  Depression screen [facility] 2/9 03/04/2021 02/01/2021  Decreased Interest 0 0  Down, Depressed, Hopeless 0 0  PHQ - 2 Score 0 0  Altered sleeping 1 0  Tired, decreased energy 0 0  Change in appetite 0 0  Feeling bad or failure about yourself  0 0  Trouble concentrating 0 0  Moving slowly or fidgety/restless 0 0  Suicidal thoughts 0 0  PHQ-9 Score 1 0  Difficult doing work/chores Not difficult at all Not  difficult at all    Past Medical History:  Diagnosis Date   Gout    Hypertension    History reviewed. No pertinent surgical history. Social History   Socioeconomic History   Marital status: Married    Spouse name: Not on file   Number of children: Not on file   Years of education: Not on file   Highest education level: Not on file  Occupational History   Not on file  Tobacco Use   Smoking status: Never   Smokeless tobacco: Never  Vaping Use   Vaping Use: Never used  Substance and Sexual Activity   Alcohol use: Yes    Alcohol/week: 1.0 standard drink    Types: 1 Standard drinks or equivalent per week   Drug use: Not on file   Sexual activity: Not on file  Other Topics Concern   Not on file  Social History Narrative   Not on file   Social Determinants of Health   Financial Resource Strain: Not on file  Food Insecurity: Not on file  Transportation Needs: Not on file  Physical Activity: Not on file  Stress: Not on file  Social Connections: Not on file  Intimate Partner Violence: Not on file   Family History  Problem Relation Age of Onset   Heart attack Maternal Grandfather 72   Heart attack Paternal Grandfather 66   Current Outpatient Medications on File Prior to Visit  Medication  Sig   amLODipine (NORVASC) 10 MG tablet Take 1 tablet (10 mg total) by mouth daily.   colchicine 0.6 MG tablet TAKE 1 TABLET BY MOUTH AS DIRECTED, MAY REPEAT DOSE ON DAY 1, THEN TAKE 1 TABLET DAILY FOR UP TO 7 TO 10 DAYS AS NEEDED FOR GOUT FLARE UNTIL RESOLVED   multivitamin-iron-minerals-folic acid (CENTRUM) chewable tablet Chew 1 tablet by mouth daily.   No current facility-administered medications on file prior to visit.    Review of Systems  Constitutional:  Negative for activity change, appetite change, chills, diaphoresis, fatigue and fever.  HENT:  Negative for congestion and hearing loss.   Eyes:  Negative for visual disturbance.  Respiratory:  Negative for cough, chest  tightness, shortness of breath and wheezing.   Cardiovascular:  Negative for chest pain, palpitations and leg swelling.  Gastrointestinal:  Negative for abdominal pain, constipation, diarrhea, nausea and vomiting.  Genitourinary:  Negative for dysuria, frequency and hematuria.  Musculoskeletal:  Negative for arthralgias and neck pain.  Skin:  Negative for rash.  Neurological:  Negative for dizziness, weakness, light-headedness, numbness and headaches.  Hematological:  Negative for adenopathy.  Psychiatric/Behavioral:  Negative for behavioral problems, dysphoric mood and sleep disturbance.   Per HPI unless specifically indicated above      Objective:    BP (!) 159/96   Pulse 79   Ht 6' (1.829 m)   Wt (!) 318 lb 12.8 oz (144.6 kg)   SpO2 100%   BMI 43.24 kg/m   Wt Readings from Last 3 Encounters:  03/04/21 (!) 318 lb 12.8 oz (144.6 kg)  02/01/21 (!) 319 lb 12.8 oz (145.1 kg)  12/01/14 300 lb (136.1 kg)    Physical Exam Vitals and nursing note reviewed.  Constitutional:      General: He is not in acute distress.    Appearance: He is well-developed. He is obese. He is not diaphoretic.     Comments: Well-appearing, comfortable, cooperative  HENT:     Head: Normocephalic and atraumatic.  Eyes:     General:        Right eye: No discharge.        Left eye: No discharge.     Conjunctiva/sclera: Conjunctivae normal.     Pupils: Pupils are equal, round, and reactive to light.  Neck:     Thyroid: No thyromegaly.  Cardiovascular:     Rate and Rhythm: Normal rate and regular rhythm.     Pulses: Normal pulses.     Heart sounds: Normal heart sounds. No murmur heard. Pulmonary:     Effort: Pulmonary effort is normal. No respiratory distress.     Breath sounds: Normal breath sounds. No wheezing or rales.  Abdominal:     General: Bowel sounds are normal. There is no distension.     Palpations: Abdomen is soft. There is no mass.     Tenderness: There is no abdominal tenderness.   Musculoskeletal:        General: No tenderness. Normal range of motion.     Cervical back: Normal range of motion and neck supple.     Comments: Upper / Lower Extremities: - Normal muscle tone, strength bilateral upper extremities 5/5, lower extremities 5/5  Lymphadenopathy:     Cervical: No cervical adenopathy.  Skin:    General: Skin is warm and dry.     Findings: No erythema or rash.  Neurological:     Mental Status: He is alert and oriented to person, place, and time.  Comments: Distal sensation intact to light touch all extremities  Psychiatric:        Mood and Affect: Mood normal.        Behavior: Behavior normal.        Thought Content: Thought content normal.     Comments: Well groomed, good eye contact, normal speech and thoughts      Results for orders placed or performed in visit on 02/24/21  Uric acid  Result Value Ref Range   Uric Acid, Serum 7.8 4.0 - 8.0 mg/dL  Hepatitis C antibody  Result Value Ref Range   Hepatitis C Ab NON-REACTIVE NON-REACTIVE   SIGNAL TO CUT-OFF 0.07 <1.00  PSA  Result Value Ref Range   PSA 0.16 < OR = 4.00 ng/mL  Hemoglobin A1c  Result Value Ref Range   Hgb A1c MFr Bld 7.6 (H) <5.7 % of total Hgb   Mean Plasma Glucose 171 mg/dL   eAG (mmol/L) 9.5 mmol/L  CBC with Differential/Platelet  Result Value Ref Range   WBC 5.8 3.8 - 10.8 Thousand/uL   RBC 5.08 4.20 - 5.80 Million/uL   Hemoglobin 14.6 13.2 - 17.1 g/dL   HCT 45.5 38.5 - 50.0 %   MCV 89.6 80.0 - 100.0 fL   MCH 28.7 27.0 - 33.0 pg   MCHC 32.1 32.0 - 36.0 g/dL   RDW 12.6 11.0 - 15.0 %   Platelets 263 140 - 400 Thousand/uL   MPV 10.1 7.5 - 12.5 fL   Neutro Abs 3,028 1,500 - 7,800 cells/uL   Lymphs Abs 1,943 850 - 3,900 cells/uL   Absolute Monocytes 609 200 - 950 cells/uL   Eosinophils Absolute 168 15 - 500 cells/uL   Basophils Absolute 52 0 - 200 cells/uL   Neutrophils Relative % 52.2 %   Total Lymphocyte 33.5 %   Monocytes Relative 10.5 %   Eosinophils Relative  2.9 %   Basophils Relative 0.9 %  Lipid panel  Result Value Ref Range   Cholesterol 199 <200 mg/dL   HDL 37 (L) > OR = 40 mg/dL   Triglycerides 168 (H) <150 mg/dL   LDL Cholesterol (Calc) 131 (H) mg/dL (calc)   Total CHOL/HDL Ratio 5.4 (H) <5.0 (calc)   Non-HDL Cholesterol (Calc) 162 (H) <130 mg/dL (calc)  COMPLETE METABOLIC PANEL WITH GFR  Result Value Ref Range   Glucose, Bld 159 (H) 65 - 99 mg/dL   BUN 12 7 - 25 mg/dL   Creat 1.14 0.70 - 1.30 mg/dL   eGFR 76 > OR = 60 mL/min/1.68m2   BUN/Creatinine Ratio NOT APPLICABLE 6 - 22 (calc)   Sodium 139 135 - 146 mmol/L   Potassium 4.8 3.5 - 5.3 mmol/L   Chloride 101 98 - 110 mmol/L   CO2 30 20 - 32 mmol/L   Calcium 10.2 8.6 - 10.3 mg/dL   Total Protein 7.1 6.1 - 8.1 g/dL   Albumin 4.4 3.6 - 5.1 g/dL   Globulin 2.7 1.9 - 3.7 g/dL (calc)   AG Ratio 1.6 1.0 - 2.5 (calc)   Total Bilirubin 0.5 0.2 - 1.2 mg/dL   Alkaline phosphatase (APISO) 27 (L) 35 - 144 U/L   AST 29 10 - 35 U/L   ALT 59 (H) 9 - 46 U/L      Assessment & Plan:   Problem List Items Addressed This Visit     Morbid obesity with BMI of 40.0-44.9, adult (HCC)   Essential hypertension    Still Elevated BP - Home BP readings  elevated even on amlodipine [id number] No known complications    Plan:  1. ADD new med. ARB olmesartan 40mg  daily - can start with half tab for 1 week then up to 40mg  daily, counseling on potential risk of angioedema benefit risk. Check chemistry Cr K in 3 months. 2. CONTINUE Amlodipine 10mg  daily 2. Encourage improved lifestyle - low sodium diet, regular exercise 3. Keep monitor BP outside office, bring readings to next visit, if persistently >140/90 or new symptoms notify office sooner      Relevant Medications   olmesartan (BENICAR) 40 MG tablet   Other Visit Diagnoses     Annual physical exam    -  Primary   Screening for colon cancer       Relevant Orders   Ambulatory referral to Gastroenterology       Updated Health Maintenance  information Refer for colonoscopy Reviewed recent lab results with patient Encouraged improvement to lifestyle with diet and exercise Goal of weight loss  Gout Stable controlled w/ Uric acid < 8 Defer Allopurinol start for now  PreDM / Elevated A1c At risk for T2DM new diagnosis, A1c 7.6, now will give 3 month trial for aggressive lifestyle intervention to improve A1c < 6.5 Reviewed risks of DM complications, future med / treatment options  HLD Elevated LDL >130 Recommended Statin therapy if unable to lower with lifestyle intervention  , Orders Placed This Encounter  Procedures   Ambulatory referral to Gastroenterology    Referral Priority:   Routine    Referral Type:   Consultation    Referral Reason:   Specialty Services Required    Number of Visits Requested:   1      Meds ordered this encounter  Medications   olmesartan (BENICAR) 40 MG tablet    Sig: Take 1 tablet (40 mg total) by mouth daily.    Dispense:  90 tablet    Refill:  1     Follow up plan: Return in about 3 months (around 06/04/2021) for 3 month fasting lab Follow-up PreDM vs DM, HTN, lab results.   Future labs ordered for 05/28/21 Lipid, CMET, A1c  Nobie Putnam, DO [facility] 03/04/2021, 11:04 AM

## 2021-03-11 ENCOUNTER — Telehealth: Payer: Self-pay

## 2021-03-11 NOTE — Telephone Encounter (Signed)
Called no answer no way to leave message

## 2021-05-28 ENCOUNTER — Other Ambulatory Visit: Payer: BC Managed Care – PPO

## 2021-05-28 ENCOUNTER — Other Ambulatory Visit: Payer: Self-pay

## 2021-05-28 DIAGNOSIS — I10 Essential (primary) hypertension: Secondary | ICD-10-CM | POA: Diagnosis not present

## 2021-05-28 DIAGNOSIS — R7309 Other abnormal glucose: Secondary | ICD-10-CM

## 2021-05-28 DIAGNOSIS — E782 Mixed hyperlipidemia: Secondary | ICD-10-CM | POA: Diagnosis not present

## 2021-05-29 LAB — COMPLETE METABOLIC PANEL WITH GFR
AG Ratio: 1.8 (calc) (ref 1.0–2.5)
ALT: 41 U/L (ref 9–46)
AST: 23 U/L (ref 10–35)
Albumin: 4.5 g/dL (ref 3.6–5.1)
Alkaline phosphatase (APISO): 25 U/L — ABNORMAL LOW (ref 35–144)
BUN: 15 mg/dL (ref 7–25)
CO2: 27 mmol/L (ref 20–32)
Calcium: 10 mg/dL (ref 8.6–10.3)
Chloride: 104 mmol/L (ref 98–110)
Creat: 1.19 mg/dL (ref 0.70–1.30)
Globulin: 2.5 g/dL (calc) (ref 1.9–3.7)
Glucose, Bld: 113 mg/dL — ABNORMAL HIGH (ref 65–99)
Potassium: 4.9 mmol/L (ref 3.5–5.3)
Sodium: 141 mmol/L (ref 135–146)
Total Bilirubin: 0.6 mg/dL (ref 0.2–1.2)
Total Protein: 7 g/dL (ref 6.1–8.1)
eGFR: 72 mL/min/{1.73_m2} (ref >=60)

## 2021-05-29 LAB — LIPID PANEL
Cholesterol: 176 mg/dL (ref <200)
HDL: 33 mg/dL — ABNORMAL LOW (ref >=40)
LDL Cholesterol (Calc): 117 mg/dL (calc) — ABNORMAL HIGH
Non-HDL Cholesterol (Calc): 143 mg/dL (calc) — ABNORMAL HIGH (ref <130)
Total CHOL/HDL Ratio: 5.3 (calc) — ABNORMAL HIGH (ref <5.0)
Triglycerides: 148 mg/dL (ref <150)

## 2021-05-29 LAB — HEMOGLOBIN A1C
Hgb A1c MFr Bld: 6.6 % of total Hgb — ABNORMAL HIGH (ref <5.7)
Mean Plasma Glucose: 143 mg/dL
eAG (mmol/L): 7.9 mmol/L

## 2021-06-04 ENCOUNTER — Other Ambulatory Visit: Payer: Self-pay

## 2021-06-04 ENCOUNTER — Encounter: Payer: Self-pay | Admitting: Family Medicine

## 2021-06-04 ENCOUNTER — Ambulatory Visit (INDEPENDENT_AMBULATORY_CARE_PROVIDER_SITE_OTHER): Payer: BC Managed Care – PPO | Admitting: Family Medicine

## 2021-06-04 VITALS — BP 146/87 | HR 74 | Ht 72.0 in | Wt 306.4 lb

## 2021-06-04 DIAGNOSIS — E782 Mixed hyperlipidemia: Secondary | ICD-10-CM | POA: Insufficient documentation

## 2021-06-04 DIAGNOSIS — I10 Essential (primary) hypertension: Secondary | ICD-10-CM

## 2021-06-04 DIAGNOSIS — R7303 Prediabetes: Secondary | ICD-10-CM | POA: Diagnosis not present

## 2021-06-04 DIAGNOSIS — Z6841 Body Mass Index (BMI) 40.0 and over, adult: Secondary | ICD-10-CM

## 2021-06-04 NOTE — Assessment & Plan Note (Signed)
Still Elevated BP - but improved - Home BP readings 354/30T No known complications    Plan:  1. Continue Olmesartan 40mg  daily and Amlodipine 10mg  daily 2. Encourage improved lifestyle - low sodium diet, regular exercise 3. Keep monitor BP outside office, bring readings to next visit, if persistently >140/90 or new symptoms notify office sooner  Goal for lower BP w/ wt loss lifestyle

## 2021-06-04 NOTE — Assessment & Plan Note (Signed)
Elevated A1c prior 7.6 down to 6.6 now with lifestyle intervention Not on medication Counseling that given dramatic improvement, no prior dx DM will not consider Type 2 Diabetic today, would still consider PreDM Concern with obesity, HTN, HLD  Plan:  1. Not on any therapy currently  2. Encourage improved lifestyle - low carb, low sugar diet, reduce portion size, continue improving regular exercise  F/u 6 months, A1c POC. Discuss future options for wt with GLP1 if interested.

## 2021-06-04 NOTE — Patient Instructions (Addendum)
Thank you for coming to the office today.  Shingrix vaccine 2 doses , 2-6 months apart, usually at pharmacy.  Call to schedule your Colonoscopy when ready. Let me know if you need help.  [facility] [place] [address] [place], [place]  [address] Phone:  [phone]  Recent Labs    02/25/21 0816 05/28/21 0840  HGBA1C 7.6* 6.6*   Keep improving lifestyle as we discussed.  BP is improved, close to normal range, but keep working.  Cholesterol improved significantly.  Liver enzyme ALT back in range.  Next time no lab except a fingerstick A1c sugar.   Please schedule a Follow-up Appointment to: Return in about 6 months (around 12/02/2021) for 6 month PreDM A1c, HTN, Wt updates.  If you have any other questions or concerns, please feel free to call the office or send a message through [place]. You may also schedule an earlier appointment if necessary.  Additionally, you may be receiving a survey about your experience at our office within a few days to 1 week by e-mail or mail. We value your feedback.  Nobie Putnam, DO [facility]

## 2021-06-04 NOTE — Assessment & Plan Note (Signed)
Improved lipids on lifestyle wt loss Last lipid 05/2021 The 10-year ASCVD risk score (Arnett DK, et al., 2019) is: 10.2%  Plan: 1. Not on med. Improved lifestyle now 2. Encourage improved lifestyle - low carb/cholesterol, reduce portion size, continue improving regular exercise

## 2021-06-04 NOTE — Assessment & Plan Note (Signed)
Encourage lifestyle management Continued wt loss

## 2021-06-04 NOTE — Progress Notes (Signed)
Subjective:    Patient ID: Alan English, male    DOB: 11-06-1965, 55 y.o.   MRN: [id number]  Alan English is a 55 y.o. male presenting on 06/04/2021 for Prediabetes and Hypertension   HPI  CHRONIC HTN: Last visit he was added Olmesartan 50m daily ARB and he increased from 20 mg half tab to whole tab for 461mHe continues Amlodipine 1054maily Home BP improved to 140s/80s Lifestyle: - Diet: Goal to improve - Exercise: Goal to improve Denies CP, dyspnea, HA, edema, dizziness / lightheadedness   HYPERLIPIDEMIA: - Reports no concerns. Last lipid panel updated in 05/2021 improved LDL from 131 to 117, total from 199 to 176. Not on therapy Admits trying to do more high protein high fiber diet    PreDiabetes / Elevated A1c Last visit A1c 7.6, discussed risk of progression to Type 2 Diabetes Recent lab now shows A1c down to 6.6 He has overhauled diet and lifestyle and reduced A1c by 1.0 point in 3 months Increased protein. And limiting simple carbs. He will do some complex grains. He has improved diet with less prepackaged snacks, does more grilled, less fried, no regular sodas, no sugar added beverages, mostly water, no added sugar to coffee/tea - Also now working on cardio and walking  LFT improved  Health Maintenance: He will call [facility] to reschedule Colonoscopy.  Shingrix at pharmacy when ready.  [facility]21 02/01/2021  Decreased Interest 0 0  Down, Depressed, Hopeless 0 0  PHQ - 2 Score 0 0  Altered sleeping 1 0  Tired, decreased energy 0 0  Change in appetite 0 0  Feeling bad or failure about yourself  0 0  Trouble concentrating 0 0  Moving slowly or fidgety/restless 0 0  Suicidal thoughts 0 0  PHQ-9 Score 1 0  Difficult doing work/chores Not difficult at all Not difficult at all    Social History   Tobacco Use   Smoking status: Never   Smokeless tobacco: Never  Vaping Use   Vaping Use: Never used  Substance Use Topics   Alcohol  use: Yes    Alcohol/week: 1.0 standard drink    Types: 1 Standard drinks or equivalent per week    Review of Systems Per HPI unless specifically indicated above     Objective:    BP (!) 146/87   Pulse 74   Ht 6' (1.829 m)   Wt (!) 306 lb 6.4 oz (139 kg)   SpO2 100%   BMI 41.56 kg/m   Wt Readings from Last 3 Encounters:  06/04/21 (!) 306 lb 6.4 oz (139 kg)  03/04/21 (!) 318 lb 12.8 oz (144.6 kg)  02/01/21 (!) 319 lb 12.8 oz (145.1 kg)    Physical Exam Vitals and nursing note reviewed.  Constitutional:      General: He is not in acute distress.    Appearance: He is well-developed. He is not diaphoretic.     Comments: Well-appearing, comfortable, cooperative  HENT:     Head: Normocephalic and atraumatic.  Eyes:     General:        Right eye: No discharge.        Left eye: No discharge.     Conjunctiva/sclera: Conjunctivae normal.  Neck:     Thyroid: No thyromegaly.  Cardiovascular:     Rate and Rhythm: Normal rate and regular rhythm.     Pulses: Normal pulses.     Heart sounds: Normal heart sounds. No murmur heard. Pulmonary:  Effort: Pulmonary effort is normal. No respiratory distress.     Breath sounds: Normal breath sounds. No wheezing or rales.  Musculoskeletal:        General: Normal range of motion.     Cervical back: Normal range of motion and neck supple.  Lymphadenopathy:     Cervical: No cervical adenopathy.  Skin:    General: Skin is warm and dry.     Findings: No erythema or rash.  Neurological:     Mental Status: He is alert and oriented to person, place, and time. Mental status is at baseline.  Psychiatric:        Behavior: Behavior normal.     Comments: Well groomed, good eye contact, normal speech and thoughts      Results for orders placed or performed in visit on 05/28/21  Hemoglobin A1c  Result Value Ref Range   Hgb A1c MFr Bld 6.6 (H) <5.7 % of total Hgb   Mean Plasma Glucose 143 mg/dL   eAG (mmol/L) 7.9 mmol/L  Lipid panel   Result Value Ref Range   Cholesterol 176 <200 mg/dL   HDL 33 (L) > OR = 40 mg/dL   Triglycerides 148 <150 mg/dL   LDL Cholesterol (Calc) 117 (H) mg/dL (calc)   Total CHOL/HDL Ratio 5.3 (H) <5.0 (calc)   Non-HDL Cholesterol (Calc) 143 (H) <130 mg/dL (calc)  COMPLETE METABOLIC PANEL WITH GFR  Result Value Ref Range   Glucose, Bld 113 (H) 65 - 99 mg/dL   BUN 15 7 - 25 mg/dL   Creat 1.19 0.70 - 1.30 mg/dL   eGFR 72 > OR = 60 mL/min/1.83m2   BUN/Creatinine Ratio NOT APPLICABLE 6 - 22 (calc)   Sodium 141 135 - 146 mmol/L   Potassium 4.9 3.5 - 5.3 mmol/L   Chloride 104 98 - 110 mmol/L   CO2 27 20 - 32 mmol/L   Calcium 10.0 8.6 - 10.3 mg/dL   Total Protein 7.0 6.1 - 8.1 g/dL   Albumin 4.5 3.6 - 5.1 g/dL   Globulin 2.5 1.9 - 3.7 g/dL (calc)   AG Ratio 1.8 1.0 - 2.5 (calc)   Total Bilirubin 0.6 0.2 - 1.2 mg/dL   Alkaline phosphatase (APISO) 25 (L) 35 - 144 U/L   AST 23 10 - 35 U/L   ALT 41 9 - 46 U/L      Assessment & Plan:   Problem List Items Addressed This Visit     Pre-diabetes    Elevated A1c prior 7.6 down to 6.6 now with lifestyle intervention Not on medication Counseling that given dramatic improvement, no prior dx DM will not consider Type 2 Diabetic today, would still consider PreDM Concern with obesity, HTN, HLD  Plan:  1. Not on any therapy currently  2. Encourage improved lifestyle - low carb, low sugar diet, reduce portion size, continue improving regular exercise  F/u 6 months, A1c POC. Discuss future options for wt with GLP1 if interested.      Morbid obesity with BMI of 40.0-44.9, adult ([place]) - Primary    Encourage lifestyle management Continued wt loss      Mixed hyperlipidemia    Improved lipids on lifestyle wt loss Last lipid 05/2021 The 10-year ASCVD risk score (Arnett DK, et al., 2019) is: 10.2%  Plan: 1. Not on med. Improved lifestyle now 2. Encourage improved lifestyle - low carb/cholesterol, reduce portion size, continue improving regular  exercise      Essential hypertension    Still Elevated BP -  but improved - Home BP readings 646/14E No known complications    Plan:  1. Continue Olmesartan 13m daily and Amlodipine 135mdaily 2. Encourage improved lifestyle - low sodium diet, regular exercise 3. Keep monitor BP outside office, bring readings to next visit, if persistently >140/90 or new symptoms notify office sooner  Goal for lower BP w/ wt loss lifestyle       No orders of the defined types were placed in this encounter.     Follow up plan: Return in about 6 months (around 12/02/2021) for 6 month PreDM A1c, HTN, Wt updates.  AlNobie PutnamDORichmondedical Group 06/04/2021, 3:43 PM

## 2021-07-29 ENCOUNTER — Other Ambulatory Visit: Payer: Self-pay | Admitting: Family Medicine

## 2021-07-29 DIAGNOSIS — I10 Essential (primary) hypertension: Secondary | ICD-10-CM

## 2021-07-29 NOTE — Telephone Encounter (Signed)
Requested Prescriptions  Pending Prescriptions Disp Refills   amLODipine (NORVASC) 10 MG tablet [Pharmacy Med Name: AMLODIPINE BESYLATE 10MG  TABLETS] 90 tablet 1    Sig: TAKE 1 TABLET(10 MG) BY MOUTH DAILY     Cardiovascular:  Calcium Channel Blockers Failed - 07/29/2021  3:17 AM      Failed - Last BP in normal range    BP Readings from Last 1 Encounters:  06/04/21 (!) 146/87         Passed - Valid encounter within last 6 months    Recent Outpatient Visits          1 month ago Morbid obesity with BMI of 40.0-44.9, adult [facility])   [place], DO   4 months ago Annual physical exam   [place], DO   5 months ago Chronic gout involving toe of right foot without tophus, unspecified cause   [place], DO      Future Appointments            In 4 months [place], Devonne Doughty, DO [facility], [facility]

## 2021-08-28 ENCOUNTER — Other Ambulatory Visit: Payer: Self-pay | Admitting: Family Medicine

## 2021-08-28 DIAGNOSIS — I10 Essential (primary) hypertension: Secondary | ICD-10-CM

## 2021-08-28 NOTE — Telephone Encounter (Signed)
Requested Prescriptions  Pending Prescriptions Disp Refills   olmesartan (BENICAR) 40 MG tablet [Pharmacy Med Name: OLMESARTAN MEDOXOMIL 40MG  TABLETS] 90 tablet 1    Sig: TAKE 1 TABLET(40 MG) BY MOUTH DAILY     Cardiovascular:  Angiotensin Receptor Blockers Failed - 08/28/2021  3:19 AM      Failed - Last BP in normal range    BP Readings from Last 1 Encounters:  06/04/21 (!) 146/87         Passed - Cr in normal range and within 180 days    Creat  Date Value Ref Range Status  05/28/2021 1.19 0.70 - 1.30 mg/dL Final         Passed - K in normal range and within 180 days    Potassium  Date Value Ref Range Status  05/28/2021 4.9 3.5 - 5.3 mmol/L Final         Passed - Patient is not pregnant      Passed - Valid encounter within last 6 months    Recent Outpatient Visits          2 months ago Morbid obesity with BMI of 40.0-44.9, adult [facility])   [place], DO   5 months ago Annual physical exam   [place], DO   6 months ago Chronic gout involving toe of right foot without tophus, unspecified cause   [place], DO      Future Appointments            In 3 months Parks Ranger, Devonne Doughty, DO [facility], [facility]

## 2021-11-25 ENCOUNTER — Other Ambulatory Visit: Payer: Self-pay | Admitting: Family Medicine

## 2021-11-25 DIAGNOSIS — I10 Essential (primary) hypertension: Secondary | ICD-10-CM

## 2021-11-25 NOTE — Telephone Encounter (Signed)
Requested Prescriptions  ?Pending Prescriptions Disp Refills  ?? olmesartan (BENICAR) 40 MG tablet [Pharmacy Med Name: OLMESARTAN MEDOXOMIL '40MG'$  TABLETS] 90 tablet 0  ?  Sig: TAKE 1 TABLET(40 MG) BY MOUTH DAILY  ?  ? Cardiovascular:  Angiotensin Receptor Blockers Failed - 11/25/2021  3:18 AM  ?  ?  Failed - Cr in normal range and within 180 days  ?  Creat  ?Date Value Ref Range Status  ?05/28/2021 1.19 0.70 - 1.30 mg/dL Final  ?   ?  ?  Failed - K in normal range and within 180 days  ?  Potassium  ?Date Value Ref Range Status  ?05/28/2021 4.9 3.5 - 5.3 mmol/L Final  ?   ?  ?  Failed - Last BP in normal range  ?  BP Readings from Last 1 Encounters:  ?06/04/21 (!) 146/87  ?   ?  ?  Passed - Patient is not pregnant  ?  ?  Passed - Valid encounter within last 6 months  ?  Recent Outpatient Visits   ?      ? 5 months ago Morbid obesity with BMI of 40.0-44.9, adult ([place])  ? [place], DO  ? 8 months ago Annual physical exam  ? [place], DO  ? 9 months ago Chronic gout involving toe of right foot without tophus, unspecified cause  ? [place], DO  ?  ?  ?Future Appointments   ?        ? In 1 week Parks Ranger Devonne Doughty, DO [facility], [place]  ?  ? ?  ?  ?  ? ? ?

## 2021-12-03 ENCOUNTER — Ambulatory Visit: Payer: BC Managed Care – PPO | Admitting: Family Medicine

## 2022-01-24 ENCOUNTER — Other Ambulatory Visit: Payer: Self-pay | Admitting: Family Medicine

## 2022-01-24 DIAGNOSIS — I10 Essential (primary) hypertension: Secondary | ICD-10-CM

## 2022-01-25 ENCOUNTER — Encounter: Payer: Self-pay | Admitting: *Deleted

## 2022-01-25 ENCOUNTER — Other Ambulatory Visit: Payer: Self-pay | Admitting: Family Medicine

## 2022-01-25 DIAGNOSIS — I10 Essential (primary) hypertension: Secondary | ICD-10-CM

## 2022-01-25 NOTE — Telephone Encounter (Signed)
Spoke with wife, updated pt's cell number. Wife is to request husband call this morning and schedule appt.so rx can be filled.

## 2022-01-25 NOTE — Telephone Encounter (Signed)
Requested Prescriptions  Pending Prescriptions Disp Refills  . amLODipine (NORVASC) 10 MG tablet [Pharmacy Med Name: AMLODIPINE BESYLATE '10MG'$  TABLETS] 30 tablet 0    Sig: TAKE 1 TABLET(10 MG) BY MOUTH DAILY     Cardiovascular: Calcium Channel Blockers 2 Failed - 01/24/2022  3:17 AM      Failed - Last BP in normal range    BP Readings from Last 1 Encounters:  06/04/21 (!) 146/87         Failed - Valid encounter within last 6 months    Recent Outpatient Visits          7 months ago Morbid obesity with BMI of 40.0-44.9, adult [facility])   [facility], Devonne Doughty, DO   10 months ago Annual physical exam   [place], DO   11 months ago Chronic gout involving toe of right foot without tophus, unspecified cause   [place], DO             Passed - Last Heart Rate in normal range    Pulse Readings from Last 1 Encounters:  06/04/21 74

## 2022-01-26 NOTE — Telephone Encounter (Signed)
Requested medication (s) are due for refill today: no  Requested medication (s) are on the active medication list: yes  Last refill:  01/25/22  Future visit scheduled: no  Notes to clinic:  Unable to refill per protocol, courtesy refill already given, routing for provider approval.      Requested Prescriptions  Pending Prescriptions Disp Refills   amLODipine (NORVASC) 10 MG tablet [Pharmacy Med Name: AMLODIPINE BESYLATE '10MG'$  TABLETS] 90 tablet     Sig: TAKE 1 TABLET(10 MG) BY MOUTH DAILY     Cardiovascular: Calcium Channel Blockers 2 Failed - 01/25/2022  5:24 PM      Failed - Last BP in normal range    BP Readings from Last 1 Encounters:  06/04/21 (!) 146/87         Failed - Valid encounter within last 6 months    Recent Outpatient Visits           7 months ago Morbid obesity with BMI of 40.0-44.9, adult [facility])   [facility], Devonne Doughty, DO   10 months ago Annual physical exam   [place], DO   11 months ago Chronic gout involving toe of right foot without tophus, unspecified cause   [place], DO              Passed - Last Heart Rate in normal range    Pulse Readings from Last 1 Encounters:  06/04/21 74

## 2022-02-28 ENCOUNTER — Other Ambulatory Visit: Payer: Self-pay | Admitting: Family Medicine

## 2022-02-28 DIAGNOSIS — I10 Essential (primary) hypertension: Secondary | ICD-10-CM

## 2022-02-28 NOTE — Telephone Encounter (Signed)
Called pt and pt was not at home. Advised person who answered will sent pt message on MyChart. Last RF 11/25/21 #90 overdue and OV And lab work. Sent pt MyChart message.  Requested Prescriptions  Pending Prescriptions Disp Refills   olmesartan (BENICAR) 40 MG tablet [Pharmacy Med Name: OLMESARTAN MEDOXOMIL '40MG'$  TABLETS] 90 tablet 0    Sig: TAKE 1 TABLET(40 MG) BY MOUTH DAILY     Cardiovascular:  Angiotensin Receptor Blockers Failed - 02/28/2022  3:21 AM      Failed - Cr in normal range and within 180 days    Creat  Date Value Ref Range Status  05/28/2021 1.19 0.70 - 1.30 mg/dL Final         Failed - K in normal range and within 180 days    Potassium  Date Value Ref Range Status  05/28/2021 4.9 3.5 - 5.3 mmol/L Final         Failed - Last BP in normal range    BP Readings from Last 1 Encounters:  06/04/21 (!) 146/87         Failed - Valid encounter within last 6 months    Recent Outpatient Visits           8 months ago Morbid obesity with BMI of 40.0-44.9, adult [facility])   [place], DO   12 months ago Annual physical exam   [place], DO   1 year ago Chronic gout involving toe of right foot without tophus, unspecified cause   [place], Devonne Doughty, [place] - Patient is not pregnant

## 2022-03-05 ENCOUNTER — Other Ambulatory Visit: Payer: Self-pay | Admitting: Family Medicine

## 2022-03-05 DIAGNOSIS — I10 Essential (primary) hypertension: Secondary | ICD-10-CM

## 2022-03-08 NOTE — Telephone Encounter (Signed)
Requested Prescriptions  Pending Prescriptions Disp Refills  . amLODipine (NORVASC) 10 MG tablet [Pharmacy Med Name: AMLODIPINE BESYLATE '10MG'$  TABLETS] 90 tablet 0    Sig: TAKE 1 TABLET(10 MG) BY MOUTH DAILY     Cardiovascular: Calcium Channel Blockers 2 Failed - 03/05/2022  3:22 AM      Failed - Last BP in normal range    BP Readings from Last 1 Encounters:  06/04/21 (!) 146/87         Failed - Valid encounter within last 6 months    Recent Outpatient Visits          9 months ago Morbid obesity with BMI of 40.0-44.9, adult [facility])   [place], DO   1 year ago Annual physical exam   [place], DO   1 year ago Chronic gout involving toe of right foot without tophus, unspecified cause   [place], DO      Future Appointments            In 3 weeks Parks Ranger, Devonne Doughty, DO [facility], [place] in normal range    Pulse Readings from Last 1 Encounters:  06/04/21 74

## 2022-04-01 ENCOUNTER — Encounter: Payer: Self-pay | Admitting: Family Medicine

## 2022-04-01 ENCOUNTER — Ambulatory Visit (INDEPENDENT_AMBULATORY_CARE_PROVIDER_SITE_OTHER): Payer: 59 | Admitting: Family Medicine

## 2022-04-01 VITALS — BP 131/77 | HR 70 | Ht 72.0 in | Wt 281.4 lb

## 2022-04-01 DIAGNOSIS — D489 Neoplasm of uncertain behavior, unspecified: Secondary | ICD-10-CM

## 2022-04-01 DIAGNOSIS — R7303 Prediabetes: Secondary | ICD-10-CM | POA: Diagnosis not present

## 2022-04-01 DIAGNOSIS — Z1211 Encounter for screening for malignant neoplasm of colon: Secondary | ICD-10-CM

## 2022-04-01 DIAGNOSIS — Z0000 Encounter for general adult medical examination without abnormal findings: Secondary | ICD-10-CM | POA: Diagnosis not present

## 2022-04-01 DIAGNOSIS — E782 Mixed hyperlipidemia: Secondary | ICD-10-CM | POA: Diagnosis not present

## 2022-04-01 DIAGNOSIS — L570 Actinic keratosis: Secondary | ICD-10-CM

## 2022-04-01 DIAGNOSIS — Z23 Encounter for immunization: Secondary | ICD-10-CM

## 2022-04-01 DIAGNOSIS — Z125 Encounter for screening for malignant neoplasm of prostate: Secondary | ICD-10-CM

## 2022-04-01 DIAGNOSIS — I10 Essential (primary) hypertension: Secondary | ICD-10-CM | POA: Diagnosis not present

## 2022-04-01 LAB — CBC WITH DIFFERENTIAL/PLATELET
Absolute Monocytes: 530 cells/uL (ref 200–950)
Basophils Relative: 0.8 %
Eosinophils Absolute: 210 cells/uL (ref 15–500)
HCT: 44.3 % (ref 38.5–50.0)
MCH: 28.9 pg (ref 27.0–33.0)
MCV: 87 fL (ref 80.0–100.0)
MPV: 9.8 fL (ref 7.5–12.5)
Monocytes Relative: 10.6 %
Neutro Abs: 2575 cells/uL (ref 1500–7800)
Neutrophils Relative %: 51.5 %
Platelets: 273 10*3/uL (ref 140–400)
Total Lymphocyte: 32.9 %

## 2022-04-01 MED ORDER — OLMESARTAN MEDOXOMIL 40 MG PO TABS: 40.0000 mg | ORAL_TABLET | Freq: Every day | ORAL | 3 refills | Status: DC

## 2022-04-01 MED ORDER — AMLODIPINE BESYLATE 10 MG PO TABS: 10.0000 mg | ORAL_TABLET | Freq: Every day | ORAL | 3 refills | Status: DC

## 2022-04-01 NOTE — Assessment & Plan Note (Addendum)
Morbid Obesity with BMI >38, improved weight significantly down 40 lbs in past Comorbid factors with HTN HLD PreDM Encourage continued success with lifestyle diet regimen

## 2022-04-01 NOTE — Progress Notes (Unsigned)
Subjective:    Patient ID: Alan English, male    DOB: 03-Apr-1966, 56 y.o.   MRN: [id number]  Alan English is a 56 y.o. male presenting on 04/01/2022 for Hypertension   HPI  Here for Annual Physical and Lab Review.   CHRONIC HTN: Improved BP overall. Asking about reducing med Current Meds - Amlodipine $RemoveBefo'10mg'QPiuShaHmwe$  daily, Olmesartan $RemoveBeforeDEI'40mg'mpiqPztzkgqUPvWw$  daily Lifestyle: - Diet: Goal to improve - Exercise: Goal to improve Denies CP, dyspnea, HA, edema, dizziness / lightheadedness   HYPERLIPIDEMIA: - Reports no concerns. Last lipid panel 02/2021, LDL 131 Due for labs today Not on therapy Admits trying to do more high protein high fiber diet   Gout Chronic problem Previously on Allopurinol, limited success. He had same number of flares and frequency in past while on med. He asks about PRN medication for flare. Often 4 times a year approximately, R big toe / ankle. Uric acid within appropriate range   PreDiabetes / Elevated A1c Obesity BMI >38 Prior elevated sugar. Labs due note previously elevated nearly T2DM range Not on medication. Major weight loss 40 lbs in 1 year with lifestyle     Health Maintenance:   Colon CA Screening: Never had colonoscopy or screening. Currently asymptomatic. No known family history of colon CA. Due for screening test considering options - referral sent today.   PSA 0.16 - negative 02/2021 Due for repeat testing lab.   Future COVID Booster when available.  Future Shingles vaccine, he has lab titer for Shingles zoster he has antibody so he has had chicken pox.   Flu Shot Due. Ordered      04/01/2022    9:47 AM 03/04/2021   10:49 AM 02/01/2021   10:10 AM  Depression screen PHQ 2/9  Decreased Interest 0 0 0  Down, Depressed, Hopeless 0 0 0  PHQ - 2 Score 0 0 0  Altered sleeping 1 1 0  Tired, decreased energy 0 0 0  Change in appetite 0 0 0  Feeling bad or failure about yourself  0 0 0  Trouble concentrating 0 0 0  Moving slowly or fidgety/restless 0 0 0   Suicidal thoughts 0 0 0  PHQ-9 Score 1 1 0  Difficult doing work/chores Not difficult at all Not difficult at all Not difficult at all    Past Medical History:  Diagnosis Date   Gout    Hypertension    History reviewed. No pertinent surgical history. Social History   Socioeconomic History   Marital status: Married    Spouse name: Not on file   Number of children: Not on file   Years of education: Not on file   Highest education level: Not on file  Occupational History   Not on file  Tobacco Use   Smoking status: Never   Smokeless tobacco: Never  Vaping Use   Vaping Use: Never used  Substance and Sexual Activity   Alcohol use: Yes    Alcohol/week: 1.0 standard drink of alcohol    Types: 1 Standard drinks or equivalent per week   Drug use: Not on file   Sexual activity: Not on file  Other Topics Concern   Not on file  Social History Narrative   Not on file   Social Determinants of Health   Financial Resource Strain: Not on file  Food Insecurity: Not on file  Transportation Needs: Not on file  Physical Activity: Not on file  Stress: Not on file  Social Connections: Not on file  Intimate Partner  Violence: Not on file   Family History  Problem Relation Age of Onset   Heart attack Maternal Grandfather 50   Heart attack Paternal Grandfather 73   Current Outpatient Medications on File Prior to Visit  Medication Sig   colchicine 0.6 MG tablet TAKE 1 TABLET BY MOUTH AS DIRECTED, MAY REPEAT DOSE ON DAY 1, THEN TAKE 1 TABLET DAILY FOR UP TO 7 TO 10 DAYS AS NEEDED FOR GOUT FLARE UNTIL RESOLVED   multivitamin-iron-minerals-folic acid (CENTRUM) chewable tablet Chew 1 tablet by mouth daily.   No current facility-administered medications on file prior to visit.    Review of Systems  Constitutional:  Negative for activity change, appetite change, chills, diaphoresis, fatigue and fever.  HENT:  Negative for congestion and hearing loss.   Eyes:  Negative for visual  disturbance.  Respiratory:  Negative for cough, chest tightness, shortness of breath and wheezing.   Cardiovascular:  Negative for chest pain, palpitations and leg swelling.  Gastrointestinal:  Negative for abdominal pain, constipation, diarrhea, nausea and vomiting.  Genitourinary:  Negative for dysuria, frequency and hematuria.  Musculoskeletal:  Negative for arthralgias and neck pain.  Skin:  Negative for rash.  Neurological:  Negative for dizziness, weakness, light-headedness, numbness and headaches.  Hematological:  Negative for adenopathy.  Psychiatric/Behavioral:  Negative for behavioral problems, dysphoric mood and sleep disturbance.    Per HPI unless specifically indicated above      Objective:    BP 131/77   Pulse 70   Ht 6' (1.829 m)   Wt 281 lb 6.4 oz (127.6 kg)   SpO2 100%   BMI 38.16 kg/m   Wt Readings from Last 3 Encounters:  04/01/22 281 lb 6.4 oz (127.6 kg)  06/04/21 (!) 306 lb 6.4 oz (139 kg)  03/04/21 (!) 318 lb 12.8 oz (144.6 kg)    Physical Exam Vitals and nursing note reviewed.  Constitutional:      General: He is not in acute distress.    Appearance: He is well-developed. He is not diaphoretic.     Comments: Well-appearing, comfortable, cooperative  HENT:     Head: Normocephalic and atraumatic.  Eyes:     General:        Right eye: No discharge.        Left eye: No discharge.     Conjunctiva/sclera: Conjunctivae normal.     Pupils: Pupils are equal, round, and reactive to light.  Neck:     Thyroid: No thyromegaly.     Vascular: No carotid bruit.  Cardiovascular:     Rate and Rhythm: Normal rate and regular rhythm.     Pulses: Normal pulses.     Heart sounds: Normal heart sounds. No murmur heard. Pulmonary:     Effort: Pulmonary effort is normal. No respiratory distress.     Breath sounds: Normal breath sounds. No wheezing or rales.  Abdominal:     General: Bowel sounds are normal. There is no distension.     Palpations: Abdomen is soft.  There is no mass.     Tenderness: There is no abdominal tenderness.  Musculoskeletal:        General: No tenderness. Normal range of motion.     Cervical back: Normal range of motion and neck supple.     Right lower leg: No edema.     Left lower leg: No edema.     Comments: Upper / Lower Extremities: - Normal muscle tone, strength bilateral upper extremities 5/5, lower extremities 5/5  Lymphadenopathy:  Cervical: No cervical adenopathy.  Skin:    General: Skin is warm and dry.     Findings: No erythema or rash.  Neurological:     Mental Status: He is alert and oriented to person, place, and time.     Comments: Distal sensation intact to light touch all extremities  Psychiatric:        Mood and Affect: Mood normal.        Behavior: Behavior normal.        Thought Content: Thought content normal.     Comments: Well groomed, good eye contact, normal speech and thoughts      Results for orders placed or performed in visit on 04/01/22  COMPLETE METABOLIC PANEL WITH GFR  Result Value Ref Range   Glucose, Bld 94 65 - 99 mg/dL   BUN 22 7 - 25 mg/dL   Creat 1.14 0.70 - 1.30 mg/dL   eGFR 75 > OR = 60 mL/min/1.3m2   BUN/Creatinine Ratio SEE NOTE: 6 - 22 (calc)   Sodium 139 135 - 146 mmol/L   Potassium 4.7 3.5 - 5.3 mmol/L   Chloride 104 98 - 110 mmol/L   CO2 26 20 - 32 mmol/L   Calcium 9.8 8.6 - 10.3 mg/dL   Total Protein 7.3 6.1 - 8.1 g/dL   Albumin 4.7 3.6 - 5.1 g/dL   Globulin 2.6 1.9 - 3.7 g/dL (calc)   AG Ratio 1.8 1.0 - 2.5 (calc)   Total Bilirubin 0.6 0.2 - 1.2 mg/dL   Alkaline phosphatase (APISO) 23 (L) 35 - 144 U/L   AST 15 10 - 35 U/L   ALT 17 9 - 46 U/L  CBC with Differential/Platelet  Result Value Ref Range   WBC 5.0 3.8 - 10.8 Thousand/uL   RBC 5.09 4.20 - 5.80 Million/uL   Hemoglobin 14.7 13.2 - 17.1 g/dL   HCT 44.3 38.5 - 50.0 %   MCV 87.0 80.0 - 100.0 fL   MCH 28.9 27.0 - 33.0 pg   MCHC 33.2 32.0 - 36.0 g/dL   RDW 12.9 11.0 - 15.0 %   Platelets 273  140 - 400 Thousand/uL   MPV 9.8 7.5 - 12.5 fL   Neutro Abs 2,575 1,500 - 7,800 cells/uL   Lymphs Abs 1,645 850 - 3,900 cells/uL   Absolute Monocytes 530 200 - 950 cells/uL   Eosinophils Absolute 210 15 - 500 cells/uL   Basophils Absolute 40 0 - 200 cells/uL   Neutrophils Relative % 51.5 %   Total Lymphocyte 32.9 %   Monocytes Relative 10.6 %   Eosinophils Relative 4.2 %   Basophils Relative 0.8 %  Lipid panel  Result Value Ref Range   Cholesterol 176 <200 mg/dL   HDL 40 > OR = 40 mg/dL   Triglycerides 99 <150 mg/dL   LDL Cholesterol (Calc) 115 (H) mg/dL (calc)   Total CHOL/HDL Ratio 4.4 <5.0 (calc)   Non-HDL Cholesterol (Calc) 136 (H) <130 mg/dL (calc)  Hemoglobin A1c  Result Value Ref Range   Hgb A1c MFr Bld 5.9 (H) <5.7 % of total Hgb   Mean Plasma Glucose 123 mg/dL   eAG (mmol/L) 6.8 mmol/L  PSA  Result Value Ref Range   PSA 0.26 < OR = 4.00 ng/mL  TSH  Result Value Ref Range   TSH 2.51 0.40 - 4.50 mIU/L      Assessment & Plan:   Problem List Items Addressed This Visit     Essential hypertension    Improved BP  repeat reading Home BP improved, 671I No known complications Goal to come off meds    Plan:  1. Continue Olmesartan 40mg  daily and Amlodipine 10mg  daily Starting soon when you are ready, cut the Olmesartan 40mg  in HALF for total 20mg  daily.  Let me know if you do well on the half dose and I can order the new lower dose if/when you are ready  2. Encourage improved lifestyle - low sodium diet, regular exercise 3. Keep monitor BP outside office, bring readings to next visit, if persistently >140/90 or new symptoms notify office sooner  Goal for lower BP w/ wt loss lifestyle      Relevant Medications   amLODipine (NORVASC) 10 MG tablet   olmesartan (BENICAR) 40 MG tablet   Other Relevant Orders   COMPLETE METABOLIC PANEL WITH GFR (Completed)   CBC with Differential/Platelet (Completed)   Mixed hyperlipidemia    Improved lipids on lifestyle wt  loss  The 10-year ASCVD risk score (Arnett DK, et al., 2019) is: 7.8%  Plan: 1. Not on med. Improved lifestyle now 2. Encourage improved lifestyle - low carb/cholesterol, reduce portion size, continue improving regular exercise      Relevant Medications   amLODipine (NORVASC) 10 MG tablet   olmesartan (BENICAR) 40 MG tablet   Other Relevant Orders   COMPLETE METABOLIC PANEL WITH GFR (Completed)   Lipid panel (Completed)   TSH (Completed)   Morbid obesity (HCC)    Morbid Obesity with BMI >38, improved weight significantly down 40 lbs in past Comorbid factors with HTN HLD PreDM Encourage continued success with lifestyle diet regimen      Relevant Orders   COMPLETE METABOLIC PANEL WITH GFR (Completed)   CBC with Differential/Platelet (Completed)   Lipid panel (Completed)   Pre-diabetes    Dramatic improved A1c Not on medication Concern with obesity, HTN, HLD  Plan:  1. Not on any therapy currently  2. Encourage improved lifestyle - low carb, low sugar diet, reduce portion size, continue improving regular exercise      Relevant Orders   Hemoglobin A1c (Completed)   Other Visit Diagnoses     Annual physical exam    -  Primary   Relevant Orders   COMPLETE METABOLIC PANEL WITH GFR (Completed)   CBC with Differential/Platelet (Completed)   Lipid panel (Completed)   Hemoglobin A1c (Completed)   PSA (Completed)   Screening for prostate cancer       Relevant Orders   PSA (Completed)   Needs flu shot       Relevant Orders   Flu Vaccine QUAD 55mo+IM (Fluarix, Fluzone & Alfiuria Quad PF)   Screening for colon cancer       Relevant Orders   Cologuard   Actinic keratoses       Neoplasm of uncertain behavior           Updated Health Maintenance information Fasting labs today, pending results Encouraged improvement to lifestyle with diet and exercise Goal of weight loss  Flu shot today  Future COVID SHingrix vaccines  Due for routine colon cancer screening. Never  had colonoscopy (not interested), no family history colon cancer. - Discussion today about recommendations for either Colonoscopy or Cologuard screening, benefits and risks of screening, interested in Cologuard, understands that if positive then recommendation is for diagnostic colonoscopy to follow-up. - Ordered Cologuard today  Orders Placed This Encounter  Procedures   Flu Vaccine QUAD 58mo+IM (Fluarix, Fluzone & Alfiuria Quad PF)   COMPLETE METABOLIC PANEL WITH GFR  CBC with Differential/Platelet   Lipid panel    Order Specific Question:   Has the patient fasted?    Answer:   Yes   Hemoglobin A1c   PSA   TSH   Cologuard     Meds ordered this encounter  Medications   amLODipine (NORVASC) 10 MG tablet    Sig: Take 1 tablet (10 mg total) by mouth daily.    Dispense:  90 tablet    Refill:  3    **Patient requests 90 days supply** add refills for future   olmesartan (BENICAR) 40 MG tablet    Sig: Take 1 tablet (40 mg total) by mouth daily.    Dispense:  90 tablet    Refill:  3    **Patients request 90 day supply** add refills for future     Follow up plan: Return in about 6 months (around 09/30/2022) for 6 month follow-up PreDM A1c, HTN adjust.  Nobie Putnam, DO [facility] 04/01/2022, 9:50 AM

## 2022-04-01 NOTE — Patient Instructions (Addendum)
Thank you for coming to the office today.  Labs today  Flu Shot today  -------------  Check BP regularly  Starting soon when you are ready, cut the Olmesartan 8m in HALF for total 278mdaily.  Continue Amlodipine 1070maily  Let me know if you do well on the half dose and I can order the new lower dose if/when you are ready  ------------------------------  [facility] Dermatologist in [place]: [address] [place] [id number]: ([phone] [phone]: 8AM-5PM Phone: [phone] [facility] Cent[facility]matology 480[place]C [phone]: [phone]----------------------  Ordered the Cologuard (home kit) test for colon cancer screening. Stay tuned for further updates.  It will be shipped to you directly. If not received in 2-4 weeks, call us [place] the company.   If you send it back and no results are received in 2-4 weeks, call us [place] the company as well!   Colon Cancer Screening: - For all adults age 16+76+utine colon cancer screening is highly recommended.     - Recent guidelines from [place] starting age of 45 86Early detection of colon cancer is important, because often there are no warning signs or symptoms, also if found early usually it can be cured. Late stage is hard to treat.   - If Cologuard is NEGATIVE, then it is good for 3 years before next due - If Cologuard is POSITIVE, then it is strongly advised to get a Colonoscopy, which allows the GI doctor to locate the source of the cancer or polyp (even very early stage) and treat it by removing it. ------------------------- Follow instructions to collect sample, you may call the company for any help or questions, 24/7 telephone support at [phone] Please schedule a Follow-up Appointment to: Return in about 6 months (around 09/30/2022) for 6 month  follow-up PreDM A1c, HTN adjust.  If you have any other questions or concerns, please feel free to call the office or send a message through MyCDunlapou may also schedule an earlier appointment if necessary.  Additionally, you may be receiving a survey about your experience at our office within a few days to 1 week by e-mail or mail. We value your feedback.  Alan English

## 2022-04-02 LAB — PSA: PSA: 0.26 ng/mL (ref <=4.00)

## 2022-04-02 LAB — CBC WITH DIFFERENTIAL/PLATELET
Basophils Absolute: 40 cells/uL (ref 0–200)
Eosinophils Relative: 4.2 %
Hemoglobin: 14.7 g/dL (ref 13.2–17.1)
Lymphs Abs: 1645 cells/uL (ref 850–3900)
MCHC: 33.2 g/dL (ref 32.0–36.0)
RBC: 5.09 10*6/uL (ref 4.20–5.80)
RDW: 12.9 % (ref 11.0–15.0)
WBC: 5 10*3/uL (ref 3.8–10.8)

## 2022-04-02 LAB — COMPLETE METABOLIC PANEL WITH GFR
AG Ratio: 1.8 (calc) (ref 1.0–2.5)
ALT: 17 U/L (ref 9–46)
AST: 15 U/L (ref 10–35)
Albumin: 4.7 g/dL (ref 3.6–5.1)
Alkaline phosphatase (APISO): 23 U/L — ABNORMAL LOW (ref 35–144)
BUN: 22 mg/dL (ref 7–25)
CO2: 26 mmol/L (ref 20–32)
Calcium: 9.8 mg/dL (ref 8.6–10.3)
Chloride: 104 mmol/L (ref 98–110)
Creat: 1.14 mg/dL (ref 0.70–1.30)
Globulin: 2.6 g/dL (calc) (ref 1.9–3.7)
Glucose, Bld: 94 mg/dL (ref 65–99)
Potassium: 4.7 mmol/L (ref 3.5–5.3)
Sodium: 139 mmol/L (ref 135–146)
Total Bilirubin: 0.6 mg/dL (ref 0.2–1.2)
Total Protein: 7.3 g/dL (ref 6.1–8.1)
eGFR: 75 mL/min/{1.73_m2} (ref >=60)

## 2022-04-02 LAB — HEMOGLOBIN A1C
Hgb A1c MFr Bld: 5.9 % of total Hgb — ABNORMAL HIGH (ref <5.7)
Mean Plasma Glucose: 123 mg/dL
eAG (mmol/L): 6.8 mmol/L

## 2022-04-02 LAB — TSH: TSH: 2.51 mIU/L (ref 0.40–4.50)

## 2022-04-02 LAB — LIPID PANEL
Cholesterol: 176 mg/dL (ref <200)
HDL: 40 mg/dL (ref >=40)
LDL Cholesterol (Calc): 115 mg/dL (calc) — ABNORMAL HIGH
Non-HDL Cholesterol (Calc): 136 mg/dL (calc) — ABNORMAL HIGH (ref <130)
Total CHOL/HDL Ratio: 4.4 (calc) (ref <5.0)
Triglycerides: 99 mg/dL (ref <150)

## 2022-04-02 NOTE — Assessment & Plan Note (Signed)
Improved lipids on lifestyle wt loss  The 10-year ASCVD risk score (Arnett DK, et al., 2019) is: 7.8%  Plan: 1. Not on med. Improved lifestyle now 2. Encourage improved lifestyle - low carb/cholesterol, reduce portion size, continue improving regular exercise

## 2022-04-02 NOTE — Assessment & Plan Note (Signed)
Improved BP repeat reading Home BP improved, 449P No known complications Goal to come off meds    Plan:  1. Continue Olmesartan '40mg'$  daily and Amlodipine '10mg'$  daily Starting soon when you are ready, cut the Olmesartan '40mg'$  in HALF for total '20mg'$  daily.  Let me know if you do well on the half dose and I can order the new lower dose if/when you are ready  2. Encourage improved lifestyle - low sodium diet, regular exercise 3. Keep monitor BP outside office, bring readings to next visit, if persistently >140/90 or new symptoms notify office sooner  Goal for lower BP w/ wt loss lifestyle

## 2022-04-02 NOTE — Assessment & Plan Note (Signed)
Dramatic improved A1c Not on medication Concern with obesity, HTN, HLD  Plan:  1. Not on any therapy currently  2. Encourage improved lifestyle - low carb, low sugar diet, reduce portion size, continue improving regular exercise

## 2022-05-12 LAB — COLOGUARD: COLOGUARD: NEGATIVE

## 2022-06-12 ENCOUNTER — Other Ambulatory Visit: Payer: Self-pay | Admitting: Family Medicine

## 2022-06-12 DIAGNOSIS — I10 Essential (primary) hypertension: Secondary | ICD-10-CM

## 2022-06-14 NOTE — Telephone Encounter (Signed)
Requested Prescriptions  Refused Prescriptions Disp Refills   olmesartan (BENICAR) 40 MG tablet [Pharmacy Med Name: OLMESARTAN MEDOXOMIL '40MG'$  TABLETS] 90 tablet 3    Sig: TAKE 1 TABLET(40 MG) BY MOUTH DAILY     Cardiovascular:  Angiotensin Receptor Blockers Passed - 06/12/2022  3:21 AM      Passed - Cr in normal range and within 180 days    Creat  Date Value Ref Range Status  04/01/2022 1.14 0.70 - 1.30 mg/dL Final         Passed - K in normal range and within 180 days    Potassium  Date Value Ref Range Status  04/01/2022 4.7 3.5 - 5.3 mmol/L Final         Passed - Patient is not pregnant      Passed - Last BP in normal range    BP Readings from Last 1 Encounters:  04/01/22 131/77         Passed - Valid encounter within last 6 months    Recent Outpatient Visits           2 months ago Annual physical exam   [facility] Olin Hauser, DO   1 year ago Morbid obesity with BMI of 40.0-44.9, adult [facility])   [place], DO   1 year ago Annual physical exam   [place], DO   1 year ago Chronic gout involving toe of right foot without tophus, unspecified cause   [place], DO       Future Appointments             In 3 months Parks Ranger, Devonne Doughty, DO [facility]

## 2022-09-30 ENCOUNTER — Ambulatory Visit (INDEPENDENT_AMBULATORY_CARE_PROVIDER_SITE_OTHER): Payer: 59 | Admitting: Family Medicine

## 2022-09-30 ENCOUNTER — Encounter: Payer: Self-pay | Admitting: Family Medicine

## 2022-09-30 ENCOUNTER — Other Ambulatory Visit: Payer: Self-pay | Admitting: Family Medicine

## 2022-09-30 VITALS — BP 126/78 | HR 76 | Ht 72.0 in | Wt 288.0 lb

## 2022-09-30 DIAGNOSIS — R7303 Prediabetes: Secondary | ICD-10-CM

## 2022-09-30 DIAGNOSIS — Z0000 Encounter for general adult medical examination without abnormal findings: Secondary | ICD-10-CM

## 2022-09-30 DIAGNOSIS — I10 Essential (primary) hypertension: Secondary | ICD-10-CM | POA: Diagnosis not present

## 2022-09-30 DIAGNOSIS — M1A9XX0 Chronic gout, unspecified, without tophus (tophi): Secondary | ICD-10-CM | POA: Diagnosis not present

## 2022-09-30 DIAGNOSIS — Z125 Encounter for screening for malignant neoplasm of prostate: Secondary | ICD-10-CM

## 2022-09-30 DIAGNOSIS — E782 Mixed hyperlipidemia: Secondary | ICD-10-CM

## 2022-09-30 LAB — POCT GLYCOSYLATED HEMOGLOBIN (HGB A1C): Hemoglobin A1C: 6.1 % — AB (ref 4.0–5.6)

## 2022-09-30 NOTE — Assessment & Plan Note (Signed)
Stable, controlled without flare Not on therapy No flares. Has colchicine PRN

## 2022-09-30 NOTE — Assessment & Plan Note (Signed)
Slight elevated A1c to 6.1 from 5.9 Not on medication Concern with obesity, HTN, HLD  Plan:  1. Not on any therapy currently  2. Encourage improved lifestyle - low carb, low sugar diet, reduce portion size, continue improving regular exercise   Keep up with the plan to add some resistance training / increase duration or frequency with exercise regimen

## 2022-09-30 NOTE — Assessment & Plan Note (Signed)
Improved BP repeat reading Home BP controlled No known complications Goal to come off meds   Plan:  Agree with plans to reduce Olmesartan 40mg  by half to 20mg  for a period of time to see if his BP maintains. Let me know if you do well on the half dose and I can order the new lower dose if/when you are ready Continue Amlodipine 10mg  daily  2. Encourage improved lifestyle - low sodium diet, regular exercise 3. Keep monitor BP outside office, bring readings to next visit, if persistently >140/90 or new symptoms notify office sooner  Goal for lower BP w/ wt loss lifestyle

## 2022-09-30 NOTE — Progress Notes (Signed)
Subjective:    Patient ID: Alan English, male    DOB: 1966-06-29, 57 y.o.   MRN: [id number]  Alan English is a 57 y.o. male presenting on 09/30/2022 for Prediabetes and Hypertension   HPI  CHRONIC HTN: Improved BP overall. Asking about reducing med. He plans to reduce Olmesartan by half from 40 to 20mg . Current Meds - Amlodipine 10mg  daily, Olmesartan 40mg  daily Lifestyle: - Diet: Goal to improve - Exercise: Goal to improve Denies CP, dyspnea, HA, edema, dizziness / lightheadedness   Gout Chronic problem Previously on Allopurinol, limited success. He had same number of flares and frequency in past while on med. He asks about PRN medication for flare. Often 4 times a year approximately, R big toe / ankle. Uric acid within appropriate range No current flares recently. He is doing well. Has not needed colchicine in while.   Pre Diabetes / Elevated A1c Obesity BMI >39 A1c up to 6.1, prior was 5.9, previously 7+ range x 1 reading. Prior elevated sugar. Labs due note previously elevated nearly T2DM range Similar to keto diet mostly complex carbs and protein, goal to add more vegetables Not on medication. Still with successful weight loss, but some gain 5-8 lbs in the past 6 months Exercise 3 x a week doing 2 miles, brisk walk 16 min mile, and walking dog 1 mile at a time.  Future Shingrix vaccine if need.     09/30/2022   10:57 AM 04/01/2022    9:47 AM 03/04/2021   10:49 AM  Depression screen PHQ 2/9  Decreased Interest 0 0 0  Down, Depressed, Hopeless 0 0 0  PHQ - 2 Score 0 0 0  Altered sleeping 1 1 1   Tired, decreased energy 0 0 0  Change in appetite 0 0 0  Feeling bad or failure about yourself  0 0 0  Trouble concentrating 0 0 0  Moving slowly or fidgety/restless 0 0 0  Suicidal thoughts 0 0 0  PHQ-9 Score 1 1 1   Difficult doing work/chores Not difficult at all Not difficult at all Not difficult at all    Social History   Tobacco Use   Smoking status: Never    Smokeless tobacco: Never  Vaping Use   Vaping Use: Never used  Substance Use Topics   Alcohol use: Yes    Alcohol/week: 1.0 standard drink of alcohol    Types: 1 Standard drinks or equivalent per week    Review of Systems Per HPI unless specifically indicated above     Objective:    BP 126/78   Pulse 76   Ht 6' (1.829 m)   Wt 288 lb (130.6 kg)   SpO2 98%   BMI 39.06 kg/m   Wt Readings from Last 3 Encounters:  09/30/22 288 lb (130.6 kg)  04/01/22 281 lb 6.4 oz (127.6 kg)  06/04/21 (!) 306 lb 6.4 oz (139 kg)    Physical Exam Vitals and nursing note reviewed.  Constitutional:      General: He is not in acute distress.    Appearance: Normal appearance. He is well-developed. He is not diaphoretic.     Comments: Well-appearing, comfortable, cooperative  HENT:     Head: Normocephalic and atraumatic.  Eyes:     General:        Right eye: No discharge.        Left eye: No discharge.     Conjunctiva/sclera: Conjunctivae normal.  Cardiovascular:     Rate and Rhythm: Normal rate.  Pulmonary:     Effort: Pulmonary effort is normal.  Skin:    General: Skin is warm and dry.     Findings: No erythema or rash.  Neurological:     Mental Status: He is alert and oriented to person, place, and time.  Psychiatric:        Mood and Affect: Mood normal.        Behavior: Behavior normal.        Thought Content: Thought content normal.     Comments: Well groomed, good eye contact, normal speech and thoughts       Results for orders placed or performed in visit on 09/30/22  POCT HgB A1C  Result Value Ref Range   Hemoglobin A1C 6.1 (A) 4.0 - 5.6 %      Assessment & Plan:   Problem List Items Addressed This Visit     Chronic gout involving toe of right foot without tophus    Stable, controlled without flare Not on therapy No flares. Has colchicine PRN      Essential hypertension    Improved BP repeat reading Home BP controlled No known complications Goal to come off  meds   Plan:  Agree with plans to reduce Olmesartan 40mg  by half to 20mg  for a period of time to see if his BP maintains. Let me know if you do well on the half dose and I can order the new lower dose if/when you are ready Continue Amlodipine 10mg  daily  2. Encourage improved lifestyle - low sodium diet, regular exercise 3. Keep monitor BP outside office, bring readings to next visit, if persistently >140/90 or new symptoms notify office sooner  Goal for lower BP w/ wt loss lifestyle      Morbid obesity ([place])   Pre-diabetes - Primary    Slight elevated A1c to 6.1 from 5.9 Not on medication Concern with obesity, HTN, HLD  Plan:  1. Not on any therapy currently  2. Encourage improved lifestyle - low carb, low sugar diet, reduce portion size, continue improving regular exercise   Keep up with the plan to add some resistance training / increase duration or frequency with exercise regimen      Relevant Orders   POCT HgB A1C (Completed)     No orders of the defined types were placed in this encounter.    Follow up plan: Return in about 6 months (around 04/02/2023) for 6 month fasting lab only then 1 week later Annual Physical.  Future labs ordered for 03/2023   Nobie Putnam, [facility] 09/30/2022, 8:42 AM

## 2022-09-30 NOTE — Patient Instructions (Addendum)
Thank you for coming to the office today.  Recent Labs    04/01/22 1026 09/30/22 0843  HGBA1C 5.9* 6.1*   Keep up with the plan to add some resistance training / increase duration or frequency with exercise regimen.  Keep up the great work!  Agree with plans to reduce Olmesartan 40mg  by half to 20mg  for a period of time to see if his BP maintains.  DUE for FASTING BLOOD WORK (no food or drink after midnight before the lab appointment, only water or coffee without cream/sugar on the morning of)  SCHEDULE "Lab Only" visit in the morning at the clinic for lab draw in 6 MONTHS   - Make sure Lab Only appointment is at about 1 week before your next appointment, so that results will be available  For Lab Results, once available within 2-3 days of blood draw, you can can log in to MyChart online to view your results and a brief explanation. Also, we can discuss results at next follow-up visit.   Please schedule a Follow-up Appointment to: Return in about 6 months (around 04/02/2023) for 6 month fasting lab only then 1 week later Annual Physical.  If you have any other questions or concerns, please feel free to call the office or send a message through [place]. You may also schedule an earlier appointment if necessary.  Additionally, you may be receiving a survey about your experience at our office within a few days to 1 week by e-mail or mail. We value your feedback.  Nobie Putnam, DO [place]

## 2022-12-03 ENCOUNTER — Other Ambulatory Visit: Payer: Self-pay | Admitting: Family Medicine

## 2022-12-03 DIAGNOSIS — I10 Essential (primary) hypertension: Secondary | ICD-10-CM

## 2022-12-05 NOTE — Telephone Encounter (Signed)
Rx 04/01/22 #90 3RF- too soon Requested Prescriptions  Pending Prescriptions Disp Refills   amLODipine (NORVASC) 10 MG tablet [Pharmacy Med Name: AMLODIPINE BESYLATE 10MG  TABLETS] 90 tablet 3    Sig: TAKE 1 TABLET(10 MG) BY MOUTH DAILY     Cardiovascular: Calcium Channel Blockers 2 Passed - 12/03/2022  8:44 AM      Passed - Last BP in normal range    BP Readings from Last 1 Encounters:  09/30/22 126/78         Passed - Last Heart Rate in normal range    Pulse Readings from Last 1 Encounters:  09/30/22 76         Passed - Valid encounter within last 6 months    Recent Outpatient Visits           2 months ago Pre-diabetes   [place] [facility] [place], Netta Neat, DO   8 months ago Annual physical exam   [place] [facility] Smitty Cords, DO   1 year ago Morbid obesity with BMI of 40.0-44.9, adult [facility])   [place] [facility] [place], Netta Neat, DO   1 year ago Annual physical exam   [place] [facility] Smitty Cords, DO   1 year ago Chronic gout involving toe of right foot without tophus, unspecified cause    [facility] [place], Netta Neat, DO       Future Appointments             In 4 months Althea Charon, Netta Neat, DO  [facility]

## 2023-03-31 ENCOUNTER — Other Ambulatory Visit: Payer: 59

## 2023-04-07 ENCOUNTER — Other Ambulatory Visit: Payer: 59

## 2023-04-07 ENCOUNTER — Encounter: Payer: 59 | Admitting: Family Medicine

## 2023-04-07 DIAGNOSIS — I10 Essential (primary) hypertension: Secondary | ICD-10-CM

## 2023-04-07 DIAGNOSIS — Z125 Encounter for screening for malignant neoplasm of prostate: Secondary | ICD-10-CM

## 2023-04-07 DIAGNOSIS — E782 Mixed hyperlipidemia: Secondary | ICD-10-CM

## 2023-04-07 DIAGNOSIS — M1A9XX0 Chronic gout, unspecified, without tophus (tophi): Secondary | ICD-10-CM

## 2023-04-07 DIAGNOSIS — Z0000 Encounter for general adult medical examination without abnormal findings: Secondary | ICD-10-CM

## 2023-04-07 DIAGNOSIS — R7303 Prediabetes: Secondary | ICD-10-CM

## 2023-04-08 LAB — CBC WITH DIFFERENTIAL/PLATELET
Absolute Monocytes: 645 cells/uL (ref 200–950)
Basophils Absolute: 42 cells/uL (ref 0–200)
Basophils Relative: 0.8 %
Eosinophils Absolute: 250 cells/uL (ref 15–500)
Eosinophils Relative: 4.8 %
HCT: 43.3 % (ref 38.5–50.0)
Hemoglobin: 14 g/dL (ref 13.2–17.1)
Lymphs Abs: 2064 cells/uL (ref 850–3900)
MCH: 29.2 pg (ref 27.0–33.0)
MCHC: 32.3 g/dL (ref 32.0–36.0)
MCV: 90.4 fL (ref 80.0–100.0)
MPV: 10 fL (ref 7.5–12.5)
Monocytes Relative: 12.4 %
Neutro Abs: 2200 cells/uL (ref 1500–7800)
Neutrophils Relative %: 42.3 %
Platelets: 258 10*3/uL (ref 140–400)
RBC: 4.79 10*6/uL (ref 4.20–5.80)
RDW: 12.7 % (ref 11.0–15.0)
Total Lymphocyte: 39.7 %
WBC: 5.2 10*3/uL (ref 3.8–10.8)

## 2023-04-08 LAB — URIC ACID: Uric Acid, Serum: 8.5 mg/dL — ABNORMAL HIGH (ref 4.0–8.0)

## 2023-04-08 LAB — COMPLETE METABOLIC PANEL WITH GFR
AG Ratio: 1.7 (calc) (ref 1.0–2.5)
ALT: 19 U/L (ref 9–46)
AST: 15 U/L (ref 10–35)
Albumin: 4.2 g/dL (ref 3.6–5.1)
Alkaline phosphatase (APISO): 27 U/L — ABNORMAL LOW (ref 35–144)
BUN: 18 mg/dL (ref 7–25)
CO2: 26 mmol/L (ref 20–32)
Calcium: 9.6 mg/dL (ref 8.6–10.3)
Chloride: 105 mmol/L (ref 98–110)
Creat: 1.16 mg/dL (ref 0.70–1.30)
Globulin: 2.5 g/dL (calc) (ref 1.9–3.7)
Glucose, Bld: 108 mg/dL (ref 65–139)
Potassium: 4.6 mmol/L (ref 3.5–5.3)
Sodium: 139 mmol/L (ref 135–146)
Total Bilirubin: 0.5 mg/dL (ref 0.2–1.2)
Total Protein: 6.7 g/dL (ref 6.1–8.1)
eGFR: 73 mL/min/{1.73_m2} (ref >=60)

## 2023-04-08 LAB — TSH: TSH: 2.3 mIU/L (ref 0.40–4.50)

## 2023-04-08 LAB — LIPID PANEL
Cholesterol: 165 mg/dL (ref <200)
HDL: 40 mg/dL (ref >=40)
LDL Cholesterol (Calc): 108 mg/dL (calc) — ABNORMAL HIGH
Non-HDL Cholesterol (Calc): 125 mg/dL (calc) (ref <130)
Total CHOL/HDL Ratio: 4.1 (calc) (ref <5.0)
Triglycerides: 79 mg/dL (ref <150)

## 2023-04-08 LAB — HEMOGLOBIN A1C
Hgb A1c MFr Bld: 6.3 % of total Hgb — ABNORMAL HIGH (ref <5.7)
Mean Plasma Glucose: 134 mg/dL
eAG (mmol/L): 7.4 mmol/L

## 2023-04-08 LAB — PSA: PSA: 0.2 ng/mL (ref <=4.00)

## 2023-04-13 ENCOUNTER — Encounter: Payer: Self-pay | Admitting: Family Medicine

## 2023-04-13 ENCOUNTER — Ambulatory Visit: Payer: 59 | Admitting: Family Medicine

## 2023-04-13 VITALS — BP 132/72 | HR 72 | Ht 70.87 in | Wt 276.0 lb

## 2023-04-13 DIAGNOSIS — M1A9XX0 Chronic gout, unspecified, without tophus (tophi): Secondary | ICD-10-CM

## 2023-04-13 DIAGNOSIS — I10 Essential (primary) hypertension: Secondary | ICD-10-CM | POA: Diagnosis not present

## 2023-04-13 DIAGNOSIS — R7303 Prediabetes: Secondary | ICD-10-CM

## 2023-04-13 DIAGNOSIS — Z808 Family history of malignant neoplasm of other organs or systems: Secondary | ICD-10-CM

## 2023-04-13 DIAGNOSIS — Z0000 Encounter for general adult medical examination without abnormal findings: Secondary | ICD-10-CM

## 2023-04-13 DIAGNOSIS — E782 Mixed hyperlipidemia: Secondary | ICD-10-CM | POA: Diagnosis not present

## 2023-04-13 DIAGNOSIS — L989 Disorder of the skin and subcutaneous tissue, unspecified: Secondary | ICD-10-CM

## 2023-04-13 MED ORDER — COLCHICINE 0.6 MG PO TABS: ORAL_TABLET | ORAL | 1 refills | Status: AC

## 2023-04-13 MED ORDER — OLMESARTAN MEDOXOMIL 40 MG PO TABS: 40.0000 mg | ORAL_TABLET | Freq: Every day | ORAL | 3 refills | Status: DC

## 2023-04-13 NOTE — Assessment & Plan Note (Signed)
Controlled No known complications   Plan:   Well-controlled on Olmesartan 40mg  and Amlodipine 10mg . Patient attempted to reduce Olmesartan dose but found blood pressure control was not maintained. -Continue Olmesartan 40mg  and Amlodipine 10mg .  Encourage improved lifestyle - low sodium diet, regular exercise Keep monitor BP outside office, bring readings to next visit, if persistently >140/90 or new symptoms notify office sooner  Goal for lower BP w/ wt loss lifestyle

## 2023-04-13 NOTE — Assessment & Plan Note (Signed)
Morbid Obesity with BMI >38, improved weight significantly down 40 lbs in past Comorbid factors with HTN HLD PreDM Encourage continued success with lifestyle diet regimen, consider medications in future

## 2023-04-13 NOTE — Patient Instructions (Addendum)
Thank you for coming to the office today.  [facility]   [address] [place], [place] [address] Hours: 8AM-5PM Phone: [phone]  [facility] [place]ermatologist in [place] Address: [address] [place], [place] [address] Phone: [phone]  [facility] Dermatology [address] [place], [place] [address] Phone: [phone]   Recent Labs    09/30/22 0843 04/07/23 0756  HGBA1C 6.1* 6.3*    ALA 600mg  to 1800 mg per day for supplement for nerve health.  We can monitor this in future.  The 10-year ASCVD risk score (Arnett DK, et al., 2019) is: 8.1%   Values used to calculate the score:     Age: 57 years     Sex: Male     Is Non-Hispanic African American: No     Diabetic: No     Tobacco smoker: No     Systolic Blood Pressure: 132 mmHg     Is BP treated: Yes     HDL Cholesterol: 40 mg/dL     Total Cholesterol: 165 mg/dL  Considering Statin medicine, let's see what the CT scan shows first.  You have been referred for a Coronary Calcium Score Cardiac CT Scan. This is a screening test for patients aged 49-50+ with cardiovascular risk factors or who are healthy but would be interested in Cardiovascular Screening for heart disease. Even if there is a family history of heart disease, this imaging can be useful. Typically it can be done every 5+ years or at a different timeline we agree on  The scan will look at the chest and mainly focus on the heart and identify early signs of calcium build up or blockages within the heart arteries. It is not 100% accurate for identifying blockages or heart disease, but it is useful to help [place] predict who may have some early changes or be at risk in the future for a heart attack or cardiovascular problem.  The results are reviewed by a Cardiologist and they will document the results. It should become available on MyChart. Typically the results are divided into percentiles based on other patients of the same demographic  and age. So it will compare your risk to others similar to you. If you have a higher score >99 or higher percentile >75%tile, it is recommended to consider Statin cholesterol therapy and or referral to Cardiologist. I will try to help explain your results and if we have questions we can contact the Cardiologist.  You will be contacted for scheduling. Usually it is done at any imaging facility through [facility], [facility] or [facility].  The cost is $99 flat fee total and it does not go through insurance, so no authorization is required.   Please schedule a Follow-up Appointment to: Return in about 6 months (around 10/11/2023) for 6 month PreDM A1c AM apt preferred.  If you have any other questions or concerns, please feel free to call the office or send a message through MyChart. You may also schedule an earlier appointment if necessary.  Additionally, you may be receiving a survey about your experience at our office within a few days to 1 week by e-mail or mail. We value your feedback.  Saralyn Pilar, DO [facility], [place]

## 2023-04-13 NOTE — Assessment & Plan Note (Addendum)
Improved lipids on lifestyle wt loss LDL 108  The 10-year ASCVD risk score (Arnett DK, et al., 2019) is: 8.1%  Plan: 1. Not on med. Improved lifestyle now 2. Encourage improved lifestyle - low carb/cholesterol, reduce portion size, continue improving regular exercise  Coronary calcium CT, reconsider Statin therapy pending results

## 2023-04-13 NOTE — Assessment & Plan Note (Signed)
A1c stable at 6.3, despite significant lifestyle modifications and weight loss. Discussed the potential need for medication in the future if A1c continues to rise, but patient prefers to continue with lifestyle modifications for now. -Continue current lifestyle modifications. -Check A1c in six months.

## 2023-04-13 NOTE — Progress Notes (Signed)
Subjective:    Patient ID: Alan English, male    DOB: 04-05-1966, 57 y.o.   MRN: [id number]  Alan English is a 57 y.o. male presenting on 04/13/2023 for Annual Exam   HPI  Discussed the use of AI scribe software for clinical note transcription with the patient, who gave verbal consent to proceed.  Here for Annual Physical and Lab Review   CHRONIC HTN: Improved BP He tried to reduce BP med by half previously, olmesartan. Current Meds - Amlodipine 10mg  daily, Olmesartan 40mg  daily Lifestyle: - Diet: Goal to improve - Exercise: Goal to improve Denies CP, dyspnea, HA, edema, dizziness / lightheadedness   Gout Chronic problem Mild elevated Uric Acid 8.5, prior range 7s He admits some mild flare lately, not even significant enough to take Colchicine. He remains off Prophylaxis therapy now still. He usually does fairly well but has mild annoying flares infrequently. Previously on Allopurinol, limited success on higher dose 300mg . He had same number of flares and frequency in past while on med. He has Colchicine AS NEEDED for significant flares 3-4 times per day Often 4 times a year approximately, R big toe / ankle.   Pre Diabetes / Elevated A1c Obesity BMI >38 He has improved diet overall, eliminating A1c up to 6.3, prior range 5.8 to 6.1 Weight down 12 lbs Similar to keto diet mostly complex carbs and protein, goal to add more vegetables Not on medication. Still with successful weight loss, but some gain 5-8 lbs in the past 6 months Exercise 3 x a week doing 2 miles, brisk walk 16 min mile, and walking dog 1 mile at a time.  He has some tingling in both hands, no paresthesia, not burning or pain or hot. Just feels different Interested to try supplement ALA  HYPERLIPIDEMIA: - Reports no concerns. Last lipid panel results total 165, HDL 40, LDL 108 Not taking Statin therapy Lifestyle - Diet: improved diet low cholesterol - Exercise: activity exercise  The 10-year ASCVD  risk score (Arnett DK, et al., 2019) is: 8.1%   Additionally skin lesion  The patient has a small skin lesion on the edge of their knee, which occasionally crusts over.    Health Maintenance:  PSA 0.20 (negative)  Colon CA Screening - Completed Cologuard 04/2022, negative, next 2026.  Defer Flu Shot until October 2024  Future COVID Booster  Tdap and Shingrix vaccines due in future.     04/13/2023    8:57 AM 09/30/2022   10:57 AM 04/01/2022    9:47 AM  Depression screen PHQ 2/9  Decreased Interest 0 0 0  Down, Depressed, Hopeless 0 0 0  PHQ - 2 Score 0 0 0  Altered sleeping 0 1 1  Tired, decreased energy 0 0 0  Change in appetite 0 0 0  Feeling bad or failure about yourself  0 0 0  Trouble concentrating 0 0 0  Moving slowly or fidgety/restless 0 0 0  Suicidal thoughts 0 0 0  PHQ-9 Score 0 1 1  Difficult doing work/chores  Not difficult at all Not difficult at all    Past Medical History:  Diagnosis Date   Gout    Hypertension    History reviewed. No pertinent surgical history. Social History   Socioeconomic History   Marital status: Married    Spouse name: Not on file   Number of children: Not on file   Years of education: Not on file   Highest education level: Not on file  Occupational  History   Not on file  Tobacco Use   Smoking status: Never   Smokeless tobacco: Never  Vaping Use   Vaping status: Never Used  Substance and Sexual Activity   Alcohol use: Yes    Alcohol/week: 1.0 standard drink of alcohol    Types: 1 Standard drinks or equivalent per week   Drug use: Not on file   Sexual activity: Not on file  Other Topics Concern   Not on file  Social History Narrative   Not on file   Social Determinants of Health   Financial Resource Strain: Not on file  Food Insecurity: Not on file  Transportation Needs: Not on file  Physical Activity: Not on file  Stress: Not on file  Social Connections: Not on file  Intimate Partner Violence: Not on  file   Family History  Problem Relation Age of Onset   Heart attack Maternal Grandfather 3   Heart attack Paternal Grandfather 71   Current Outpatient Medications on File Prior to Visit  Medication Sig   amLODipine (NORVASC) 10 MG tablet TAKE 1 TABLET(10 MG) BY MOUTH DAILY   multivitamin-iron-minerals-folic acid (CENTRUM) chewable tablet Chew 1 tablet by mouth daily.   No current facility-administered medications on file prior to visit.    Review of Systems  Constitutional:  Negative for activity change, appetite change, chills, diaphoresis, fatigue and fever.  HENT:  Negative for congestion and hearing loss.   Eyes:  Negative for visual disturbance.  Respiratory:  Negative for cough, chest tightness, shortness of breath and wheezing.   Cardiovascular:  Negative for chest pain, palpitations and leg swelling.  Gastrointestinal:  Negative for abdominal pain, constipation, diarrhea, nausea and vomiting.  Genitourinary:  Negative for dysuria, frequency and hematuria.  Musculoskeletal:  Negative for arthralgias and neck pain.  Skin:  Negative for rash.  Neurological:  Negative for dizziness, weakness, light-headedness, numbness and headaches.  Hematological:  Negative for adenopathy.  Psychiatric/Behavioral:  Negative for behavioral problems, dysphoric mood and sleep disturbance.    Per HPI unless specifically indicated above      Objective:    BP 132/72   Pulse 72   Ht 5' 10.87" (1.8 m)   Wt 276 lb (125.2 kg)   SpO2 98%   BMI 38.64 kg/m   Wt Readings from Last 3 Encounters:  04/13/23 276 lb (125.2 kg)  09/30/22 288 lb (130.6 kg)  04/01/22 281 lb 6.4 oz (127.6 kg)    Physical Exam Vitals and nursing note reviewed.  Constitutional:      General: He is not in acute distress.    Appearance: He is well-developed. He is not diaphoretic.     Comments: Well-appearing, comfortable, cooperative  HENT:     Head: Normocephalic and atraumatic.  Eyes:     General:        Right  eye: No discharge.        Left eye: No discharge.     Conjunctiva/sclera: Conjunctivae normal.     Pupils: Pupils are equal, round, and reactive to light.  Neck:     Thyroid: No thyromegaly.  Cardiovascular:     Rate and Rhythm: Normal rate and regular rhythm.     Pulses: Normal pulses.     Heart sounds: Normal heart sounds. No murmur heard. Pulmonary:     Effort: Pulmonary effort is normal. No respiratory distress.     Breath sounds: Normal breath sounds. No wheezing or rales.  Abdominal:     General: Bowel sounds are normal.  There is no distension.     Palpations: Abdomen is soft. There is no mass.     Tenderness: There is no abdominal tenderness.  Musculoskeletal:        General: No tenderness. Normal range of motion.     Cervical back: Normal range of motion and neck supple.     Comments: Upper / Lower Extremities: - Normal muscle tone, strength bilateral upper extremities 5/5, lower extremities 5/5  Lymphadenopathy:     Cervical: No cervical adenopathy.  Skin:    General: Skin is warm and dry.     Findings: Lesion (photo of left lower leg non healing raised pearly skin lesion) present. No erythema or rash.  Neurological:     Mental Status: He is alert and oriented to person, place, and time.     Comments: Distal sensation intact to light touch all extremities  Psychiatric:        Mood and Affect: Mood normal.        Behavior: Behavior normal.        Thought Content: Thought content normal.     Comments: Well groomed, good eye contact, normal speech and thoughts     Left Lower Leg non healing skin lesion   Results for orders placed or performed in visit on 04/07/23  TSH  Result Value Ref Range   TSH 2.30 0.40 - 4.50 mIU/L  Uric acid  Result Value Ref Range   Uric Acid, Serum 8.5 (H) 4.0 - 8.0 mg/dL  PSA  Result Value Ref Range   PSA 0.20 < OR = 4.00 ng/mL  Hemoglobin A1c  Result Value Ref Range   Hgb A1c MFr Bld 6.3 (H) <5.7 % of total Hgb   Mean Plasma  Glucose 134 mg/dL   eAG (mmol/L) 7.4 mmol/L  Lipid panel  Result Value Ref Range   Cholesterol 165 <200 mg/dL   HDL 40 > OR = 40 mg/dL   Triglycerides 79 <604 mg/dL   LDL Cholesterol (Calc) 108 (H) mg/dL (calc)   Total CHOL/HDL Ratio 4.1 <5.0 (calc)   Non-HDL Cholesterol (Calc) 125 <130 mg/dL (calc)  CBC with Differential/Platelet  Result Value Ref Range   WBC 5.2 3.8 - 10.8 Thousand/uL   RBC 4.79 4.20 - 5.80 Million/uL   Hemoglobin 14.0 13.2 - 17.1 g/dL   HCT 54.0 98.1 - 19.1 %   MCV 90.4 80.0 - 100.0 fL   MCH 29.2 27.0 - 33.0 pg   MCHC 32.3 32.0 - 36.0 g/dL   RDW 47.8 29.5 - 62.1 %   Platelets 258 140 - 400 Thousand/uL   MPV 10.0 7.5 - 12.5 fL   Neutro Abs 2,200 1,500 - 7,800 cells/uL   Lymphs Abs 2,064 850 - 3,900 cells/uL   Absolute Monocytes 645 200 - 950 cells/uL   Eosinophils Absolute 250 15 - 500 cells/uL   Basophils Absolute 42 0 - 200 cells/uL   Neutrophils Relative % 42.3 %   Total Lymphocyte 39.7 %   Monocytes Relative 12.4 %   Eosinophils Relative 4.8 %   Basophils Relative 0.8 %  COMPLETE METABOLIC PANEL WITH GFR  Result Value Ref Range   Glucose, Bld 108 65 - 139 mg/dL   BUN 18 7 - 25 mg/dL   Creat 3.08 6.57 - 8.46 mg/dL   eGFR 73 > OR = 60 NG/EXB/2.84X3   BUN/Creatinine Ratio SEE NOTE: 6 - 22 (calc)   Sodium 139 135 - 146 mmol/L   Potassium 4.6 3.5 - 5.3 mmol/L  Chloride 105 98 - 110 mmol/L   CO2 26 20 - 32 mmol/L   Calcium 9.6 8.6 - 10.3 mg/dL   Total Protein 6.7 6.1 - 8.1 g/dL   Albumin 4.2 3.6 - 5.1 g/dL   Globulin 2.5 1.9 - 3.7 g/dL (calc)   AG Ratio 1.7 1.0 - 2.5 (calc)   Total Bilirubin 0.5 0.2 - 1.2 mg/dL   Alkaline phosphatase (APISO) 27 (L) 35 - 144 U/L   AST 15 10 - 35 U/L   ALT 19 9 - 46 U/L      Assessment & Plan:   Problem List Items Addressed This Visit     Chronic gout involving toe of right foot without tophus    Stable, controlled infrequent flares Minor symptoms w/o flares Uric Acid 8.5 No longer on Allopurinol Has  colchicine PRN      Relevant Medications   colchicine 0.6 MG tablet   Essential hypertension    Controlled No known complications   Plan:   Well-controlled on Olmesartan 40mg  and Amlodipine 10mg . Patient attempted to reduce Olmesartan dose but found blood pressure control was not maintained. -Continue Olmesartan 40mg  and Amlodipine 10mg .  Encourage improved lifestyle - low sodium diet, regular exercise Keep monitor BP outside office, bring readings to next visit, if persistently >140/90 or new symptoms notify office sooner  Goal for lower BP w/ wt loss lifestyle      Relevant Medications   olmesartan (BENICAR) 40 MG tablet   Other Relevant Orders   CT CARDIAC SCORING (SELF PAY ONLY)   Mixed hyperlipidemia    Improved lipids on lifestyle wt loss LDL 108  The 10-year ASCVD risk score (Arnett DK, et al., 2019) is: 8.1%  Plan: 1. Not on med. Improved lifestyle now 2. Encourage improved lifestyle - low carb/cholesterol, reduce portion size, continue improving regular exercise  Coronary calcium CT, reconsider Statin therapy pending results      Relevant Medications   olmesartan (BENICAR) 40 MG tablet   Other Relevant Orders   CT CARDIAC SCORING (SELF PAY ONLY)   Morbid obesity (HCC)    Morbid Obesity with BMI >38, improved weight significantly down 40 lbs in past Comorbid factors with HTN HLD PreDM Encourage continued success with lifestyle diet regimen, consider medications in future      Relevant Orders   CT CARDIAC SCORING (SELF PAY ONLY)   Pre-diabetes    A1c stable at 6.3, despite significant lifestyle modifications and weight loss. Discussed the potential need for medication in the future if A1c continues to rise, but patient prefers to continue with lifestyle modifications for now. -Continue current lifestyle modifications. -Check A1c in six months.      Relevant Orders   CT CARDIAC SCORING (SELF PAY ONLY)   Other Visit Diagnoses     Annual physical exam     -  Primary   Non-healing skin lesion       Relevant Orders   Ambulatory referral to Dermatology   Family history of melanoma       Relevant Orders   Ambulatory referral to Dermatology       Updated Health Maintenance information Reviewed recent lab results with patient Encouraged improvement to lifestyle with diet and exercise Goal of weight loss   Skin lesion on knee Patient reports a small, occasionally irritated lesion on the knee. Discussed potential need for dermatology evaluation given family history of melanoma. -Refer to dermatology for evaluation of skin lesion.  General Health Maintenance -Administer influenza vaccine and COVID-19  booster at next visit. -Order heart CT scan for further cardiovascular risk stratification. -Continue regular cancer screenings (Cologuard done in October 2023, PSA negative).        Orders Placed This Encounter  Procedures   CT CARDIAC SCORING (SELF PAY ONLY)    Standing Status:   Future    Standing Expiration Date:   04/12/2024    Order Specific Question:   Preferred imaging location?    Answer:   [facility]   Ambulatory referral to Dermatology    Referral Priority:   Routine    Referral Type:   Consultation    Referral Reason:   Specialty Services Required    Requested Specialty:   Dermatology    Number of Visits Requested:   1      Meds ordered this encounter  Medications   colchicine 0.6 MG tablet    Sig: TAKE 1 TABLET BY MOUTH AS DIRECTED, MAY REPEAT DOSE ON DAY 1, THEN TAKE 1 TABLET DAILY FOR UP TO 7 TO 10 DAYS AS NEEDED FOR GOUT FLARE UNTIL RESOLVED    Dispense:  30 tablet    Refill:  1   olmesartan (BENICAR) 40 MG tablet    Sig: Take 1 tablet (40 mg total) by mouth daily.    Dispense:  90 tablet    Refill:  3    **Patients request 90 day supply** add refills for future      Follow up plan: Return in about 6 months (around 10/11/2023) for 6 month PreDM A1c AM apt preferred.  Saralyn Pilar,  DO [facility] 04/13/2023, 8:36 AM

## 2023-04-13 NOTE — Assessment & Plan Note (Signed)
Stable, controlled infrequent flares Minor symptoms w/o flares Uric Acid 8.5 No longer on Allopurinol Has colchicine PRN

## 2023-04-21 ENCOUNTER — Other Ambulatory Visit: Payer: Self-pay | Admitting: Family Medicine

## 2023-04-21 ENCOUNTER — Ambulatory Visit
Admission: RE | Admit: 2023-04-21 | Discharge: 2023-04-21 | Disposition: A | Discharge status: Home / Self Care | Payer: Self-pay | Source: Ambulatory Visit | Attending: Family Medicine | Admitting: Family Medicine

## 2023-04-21 DIAGNOSIS — E782 Mixed hyperlipidemia: Secondary | ICD-10-CM

## 2023-04-21 DIAGNOSIS — R7303 Prediabetes: Secondary | ICD-10-CM

## 2023-04-21 DIAGNOSIS — I10 Essential (primary) hypertension: Secondary | ICD-10-CM | POA: Insufficient documentation

## 2023-04-21 DIAGNOSIS — R931 Abnormal findings on diagnostic imaging of heart and coronary circulation: Secondary | ICD-10-CM

## 2023-06-08 ENCOUNTER — Other Ambulatory Visit: Payer: Self-pay | Admitting: Family Medicine

## 2023-06-08 DIAGNOSIS — I10 Essential (primary) hypertension: Secondary | ICD-10-CM

## 2023-06-09 NOTE — Telephone Encounter (Signed)
Requested Prescriptions  Pending Prescriptions Disp Refills   amLODipine (NORVASC) 10 MG tablet [Pharmacy Med Name: AMLODIPINE BESYLATE 10MG  TABLETS] 90 tablet 1    Sig: TAKE 1 TABLET(10 MG) BY MOUTH DAILY     Cardiovascular: Calcium Channel Blockers 2 Passed - 06/08/2023  3:20 AM      Passed - Last BP in normal range    BP Readings from Last 1 Encounters:  04/13/23 132/72         Passed - Last Heart Rate in normal range    Pulse Readings from Last 1 Encounters:  04/13/23 72         Passed - Valid encounter within last 6 months    Recent Outpatient Visits           1 month ago Annual physical exam   [place] [facility] [place], Netta Neat, DO   8 months ago Pre-diabetes   [place] [facility] Althea Charon, Netta Neat, DO   1 year ago Annual physical exam   [place] [facility] Smitty Cords, DO   2 years ago Morbid obesity with BMI of 40.0-44.9, adult [facility])   [place] [facility] Smitty Cords, DO   2 years ago Annual physical exam   [place] [facility] Smitty Cords, DO       Future Appointments             In 3 weeks Ag[place], Arlys John, MD [facility] [place]   In 4 months Althea Charon, Netta Neat, DO [place] [facility]   In 10 months Deirdre Evener, MD [facility]

## 2023-07-03 ENCOUNTER — Encounter: Payer: Self-pay | Admitting: Cardiology

## 2023-07-03 ENCOUNTER — Ambulatory Visit: Payer: 59 | Attending: Cardiology | Admitting: Cardiology

## 2023-07-03 VITALS — BP 132/72 | HR 77 | Ht 72.0 in | Wt 274.5 lb

## 2023-07-03 DIAGNOSIS — I10 Essential (primary) hypertension: Secondary | ICD-10-CM

## 2023-07-03 DIAGNOSIS — I2510 Atherosclerotic heart disease of native coronary artery without angina pectoris: Secondary | ICD-10-CM | POA: Diagnosis not present

## 2023-07-03 DIAGNOSIS — E785 Hyperlipidemia, unspecified: Secondary | ICD-10-CM

## 2023-07-03 MED ORDER — ROSUVASTATIN CALCIUM 20 MG PO TABS: 20.0000 mg | ORAL_TABLET | Freq: Every day | ORAL | 3 refills | Status: DC

## 2023-07-03 NOTE — Progress Notes (Signed)
Cardiology Office Note:    Date:  07/03/2023   ID:  Alan English, DOB 11/04/1965, MRN [id number]  PCP:  Smitty Cords, DO   [facility] Providers Cardiologist:  Debbe Odea, MD     Referring MD: Saralyn Pilar *   Chief Complaint  Patient presents with   New Patient (Initial Visit)    Referred by PCP --  Elevated coronary artery calcium score - Mixed hyperlipidemia - Essential hypertension - Pre-diabetes   Meds reviewed verbally with paitent.    Alan English is a 57 y.o. male who is being seen today for the evaluation of primary calcification at the request of Saralyn Pilar *.   History of Present Illness:    Alan English is a 57 y.o. male with a hx of hypertension, gout presenting with elevated calcium score.  Patient had a screening coronary calcium score 04/21/2023 with value of 451 which is 93rd percentile.  He denies chest pain or shortness of breath.  Grandfather had an MI age 50s.  Father has carotid stenosis.  Blood pressures are adequately controlled on current BP meds.  Feels well otherwise.  Started on aspirin after elevated calcium score.  Past Medical History:  Diagnosis Date   Gout    Hypertension     History reviewed. No pertinent surgical history.  Current Medications: Current Meds  Medication Sig   amLODipine (NORVASC) 10 MG tablet TAKE 1 TABLET(10 MG) BY MOUTH DAILY   aspirin EC 81 MG tablet Take 81 mg by mouth daily. Swallow whole.   colchicine 0.6 MG tablet TAKE 1 TABLET BY MOUTH AS DIRECTED, MAY REPEAT DOSE ON DAY 1, THEN TAKE 1 TABLET DAILY FOR UP TO 7 TO 10 DAYS AS NEEDED FOR GOUT FLARE UNTIL RESOLVED   multivitamin-iron-minerals-folic acid (CENTRUM) chewable tablet Chew 1 tablet by mouth daily.   olmesartan (BENICAR) 40 MG tablet Take 1 tablet (40 mg total) by mouth daily.   rosuvastatin (CRESTOR) 20 MG tablet Take 1 tablet (20 mg total) by mouth daily.     Allergies:   Naproxen   Social  History   Socioeconomic History   Marital status: Married    Spouse name: Not on file   Number of children: Not on file   Years of education: Not on file   Highest education level: Not on file  Occupational History   Not on file  Tobacco Use   Smoking status: Never   Smokeless tobacco: Never  Vaping Use   Vaping status: Never Used  Substance and Sexual Activity   Alcohol use: Yes    Alcohol/week: 1.0 standard drink of alcohol    Types: 1 Standard drinks or equivalent per week   Drug use: Not on file   Sexual activity: Not on file  Other Topics Concern   Not on file  Social History Narrative   Not on file   Social Drivers of Health   Financial Resource Strain: Not on file  Food Insecurity: Not on file  Transportation Needs: Not on file  Physical Activity: Not on file  Stress: Not on file  Social Connections: Not on file     Family History: The patient's family history includes Heart attack (age of onset: 29) in his paternal grandfather; Heart attack (age of onset: 50) in his maternal grandfather.  ROS:   Please see the history of present illness.     All other systems reviewed and are negative.  EKGs/Labs/Other Studies Reviewed:    The following studies were  reviewed today:  EKG Interpretation Date/Time:  Monday July 03 2023 08:55:02 EST Ventricular Rate:  72 PR Interval:  152 QRS Duration:  112 QT Interval:  378 QTC Calculation: 413 R Axis:   71  Text Interpretation: Normal sinus rhythm Normal ECG Confirmed by Debbe Odea ([id number]) on 07/03/2023 9:02:45 AM    Recent Labs: 04/07/2023: ALT 19; BUN 18; Creat 1.16; Hemoglobin 14.0; Platelets 258; Potassium 4.6; Sodium 139; TSH 2.30  Recent Lipid Panel    Component Value Date/Time   CHOL 165 04/07/2023 0756   TRIG 79 04/07/2023 0756   HDL 40 04/07/2023 0756   CHOLHDL 4.1 04/07/2023 0756   LDLCALC 108 (H) 04/07/2023 0756     Risk Assessment/Calculations:             Physical Exam:    VS:   BP 132/72 (BP Location: Left Arm, Patient Position: Sitting, Cuff Size: Large)   Pulse 77   Ht 6' (1.829 m)   Wt 274 lb 8 oz (124.5 kg)   SpO2 98%   BMI 37.23 kg/m     Wt Readings from Last 3 Encounters:  07/03/23 274 lb 8 oz (124.5 kg)  04/13/23 276 lb (125.2 kg)  09/30/22 288 lb (130.6 kg)     GEN:  Well nourished, well developed in no acute distress HEENT: Normal NECK: No JVD; No carotid bruits CARDIAC: RRR, no murmurs, rubs, gallops RESPIRATORY:  Clear to auscultation without rales, wheezing or rhonchi  ABDOMEN: Soft, non-tender, non-distended MUSCULOSKELETAL:  No edema; No deformity  SKIN: Warm and dry NEUROLOGIC:  Alert and oriented x 3 PSYCHIATRIC:  Normal affect   ASSESSMENT:    1. Coronary artery disease involving native coronary artery of native heart, unspecified whether angina present   2. Primary hypertension   3. Hyperlipidemia LDL goal <70    PLAN:    In order of problems listed above:  CAD/three-vessel coronary calcification, calcium score 451/93rd percentile.  Start aspirin 81 mg daily, start Crestor 20 mg daily.  Obtain echocardiogram. Hypertension, BP controlled.  Continue Benicar 40 mg daily, Norvasc 10 mg daily. Goal LDL less than 70.  Start Crestor 20 mg daily.  Follow-up in 2 months.     Medication Adjustments/Labs and Tests Ordered: Current medicines are reviewed at length with the patient today.  Concerns regarding medicines are outlined above.  Orders Placed This Encounter  Procedures   EKG 12-Lead   ECHOCARDIOGRAM COMPLETE   Meds ordered this encounter  Medications   rosuvastatin (CRESTOR) 20 MG tablet    Sig: Take 1 tablet (20 mg total) by mouth daily.    Dispense:  90 tablet    Refill:  3    Patient Instructions  Medication Instructions:   START Rosuvastatin - Take one tablet ( 20mg ) by mouth daily.   *If you need a refill on your cardiac medications before your next appointment, please call your pharmacy*   Lab  Work:  None Ordered  If you have labs (blood work) drawn today and your tests are completely normal, you will receive your results only by: MyChart Message (if you have MyChart) OR A paper copy in the mail If you have any lab test that is abnormal or we need to change your treatment, we will call you to review the results.   Testing/Procedures:  Your physician has requested that you have an echocardiogram. Echocardiography is a painless test that uses sound waves to create images of your heart. It provides your doctor with information about the  size and shape of your heart and how well your heart's chambers and valves are working. This procedure takes approximately one hour. There are no restrictions for this procedure. Please do NOT wear cologne, perfume, aftershave, or lotions (deodorant is allowed). Please arrive 15 minutes prior to your appointment time.  Please note: We ask at that you not bring children with you during ultrasound (echo/ vascular) testing. Due to room size and safety concerns, children are not allowed in the ultrasound rooms during exams. Our front office staff cannot provide observation of children in our lobby area while testing is being conducted. An adult accompanying a patient to their appointment will only be allowed in the ultrasound room at the discretion of the ultrasound technician under special circumstances. We apologize for any inconvenience.    Follow-Up: At [facility], you and your health needs are our priority.  As part of our continuing mission to provide you with exceptional heart care, we have created designated Provider Care Teams.  These Care Teams include your primary Cardiologist (physician) and Advanced Practice Providers (APPs -  Physician Assistants and Nurse Practitioners) who all work together to provide you with the care you need, when you need it.  We recommend signing up for the patient portal called "MyChart".  Sign up  information is provided on this After Visit Summary.  MyChart is used to connect with patients for Virtual Visits (Telemedicine).  Patients are able to view lab/test results, encounter notes, upcoming appointments, etc.  Non-urgent messages can be sent to your provider as well.   To learn more about what you can do with MyChart, go to [email or web address].    Your next appointment:   2 month(s)  Provider:   You may see Debbe Odea, MD or one of the following Advanced Practice Providers on your designated Care Team:   Nicolasa Ducking, NP Eula Listen, PA-C Cadence Fransico Michael, PA-C Charlsie Quest, NP Carlos Levering, NP   Signed, Debbe Odea, MD  07/03/2023 9:37 AM    [facility]

## 2023-07-03 NOTE — Patient Instructions (Signed)
Medication Instructions:   START Rosuvastatin - Take one tablet ( 20mg ) by mouth daily.   *If you need a refill on your cardiac medications before your next appointment, please call your pharmacy*   Lab Work:  None Ordered  If you have labs (blood work) drawn today and your tests are completely normal, you will receive your results only by: MyChart Message (if you have MyChart) OR A paper copy in the mail If you have any lab test that is abnormal or we need to change your treatment, we will call you to review the results.   Testing/Procedures:  Your physician has requested that you have an echocardiogram. Echocardiography is a painless test that uses sound waves to create images of your heart. It provides your doctor with information about the size and shape of your heart and how well your heart's chambers and valves are working. This procedure takes approximately one hour. There are no restrictions for this procedure. Please do NOT wear cologne, perfume, aftershave, or lotions (deodorant is allowed). Please arrive 15 minutes prior to your appointment time.  Please note: We ask at that you not bring children with you during ultrasound (echo/ vascular) testing. Due to room size and safety concerns, children are not allowed in the ultrasound rooms during exams. Our front office staff cannot provide observation of children in our lobby area while testing is being conducted. An adult accompanying a patient to their appointment will only be allowed in the ultrasound room at the discretion of the ultrasound technician under special circumstances. We apologize for any inconvenience.    Follow-Up: At [facility], you and your health needs are our priority.  As part of our continuing mission to provide you with exceptional heart care, we have created designated Provider Care Teams.  These Care Teams include your primary Cardiologist (physician) and Advanced Practice Providers (APPs -   Physician Assistants and Nurse Practitioners) who all work together to provide you with the care you need, when you need it.  We recommend signing up for the patient portal called "MyChart".  Sign up information is provided on this After Visit Summary.  MyChart is used to connect with patients for Virtual Visits (Telemedicine).  Patients are able to view lab/test results, encounter notes, upcoming appointments, etc.  Non-urgent messages can be sent to your provider as well.   To learn more about what you can do with MyChart, go to [email or web address].    Your next appointment:   2 month(s)  Provider:   You may see Debbe Odea, MD or one of the following Advanced Practice Providers on your designated Care Team:   Nicolasa Ducking, NP Eula Listen, PA-C Cadence Fransico Michael, PA-C Charlsie Quest, NP Carlos Levering, NP

## 2023-07-20 ENCOUNTER — Ambulatory Visit: Payer: 59 | Attending: Cardiology

## 2023-07-20 DIAGNOSIS — I2510 Atherosclerotic heart disease of native coronary artery without angina pectoris: Secondary | ICD-10-CM

## 2023-07-20 LAB — ECHOCARDIOGRAM COMPLETE
AR max vel: 3.56 cm2
AV Area VTI: 3.8 cm2
AV Area mean vel: 3.74 cm2
AV Mean grad: 4 mm[Hg]
AV Peak grad: 8.1 mm[Hg]
Ao pk vel: 1.42 m/s
Area-P 1/2: 3.37 cm2
Calc EF: 54.9 %
S' Lateral: 3.5 cm
Single Plane A2C EF: 53.2 %
Single Plane A4C EF: 53.4 %

## 2023-09-06 NOTE — Progress Notes (Signed)
   Cardiology Clinic Note   Date: 09/11/2023 ID: Alan English, Alan English 06-Jun-1966, MRN [id number]  Primary Cardiologist:  Debbe Odea, MD  Chief Complaint   Alan English is a 58 y.o. male who presents to the clinic today for routine follow up.   Patient Profile   Alan English is followed by Dr. Azucena Cecil for the history outlined below.      Past medical history significant for: Elevated coronary artery calcium score. CT cardiac scoring 04/21/2023: Calcium score 451 (93rd percentile).  CAC > 300 in LAD, LCx, RCA. Echo 07/20/2023: EF 55 to 60%.  No RWMA.  Mild LVH.  Normal diastolic parameters.  Normal RA size/function.  Mild LAE.  Trivial MR.  Mild dilatation of aortic root 39 mm. Hyperlipidemia. Lipid panel 04/07/2023: LDL 108, HDL 40, TG 79, total 165.  Hypertension. Prediabetes. Gout.  In summary, patient was first evaluated by Dr. Azucena Cecil on 07/03/2023 for elevated coronary calcium score at the rest of Dr. Althea Charon. He denies any cardiac symptoms. He reported family history of MI in grandfather at age 29.  He was started on aspirin and Crestor.  He underwent echo for further evaluation as detailed above.     History of Present Illness    Today, patient reports he is doing well. Patient denies shortness of breath, dyspnea on exertion, lower extremity edema, orthopnea or PND. No chest pain, pressure, or tightness. No palpitations.  He is very active walking 2 miles 3 times a week while at work in [employer] and walking his dog 2-3 times a day when he is home. He has made a lot of lifestyle changes and follows a low sodium, heart healthy diet.     ROS: All other systems reviewed and are otherwise negative except as noted in History of Present Illness.  EKGs/Labs Reviewed       EKG is not ordered today.   04/07/2023: ALT 19; AST 15; BUN 18; Creat 1.16; Potassium 4.6; Sodium 139   04/07/2023: Hemoglobin 14.0; WBC 5.2   04/07/2023: TSH 2.30    Physical Exam    VS:  BP  134/80   Pulse 65   Ht 6' (1.829 m)   Wt 281 lb (127.5 kg)   SpO2 99%   BMI 38.11 kg/m  , BMI Body mass index is 38.11 kg/m.  GEN: Well nourished, well developed, in no acute distress. Neck: No JVD or carotid bruits. Cardiac:  RRR. No murmurs. No rubs or gallops.   Respiratory:  Respirations regular and unlabored. Clear to auscultation without rales, wheezing or rhonchi. GI: Soft, nontender, nondistended. Extremities: Radials/DP/PT 2+ and equal bilaterally. No clubbing or cyanosis. No edema.  Skin: Warm and dry, no rash. Neuro: Strength intact.  Assessment & Plan   Elevated coronary artery calcium score/hyperlipidemia CT cardiac scoring October 2024 showed three-vessel coronary calcification with calcium score of 451 (93rd percentile).  Echo showed normal LV/RV function with normal diastolic parameters.  LDL September 2024 108, not at goal.  Patient denies chest pain, pressure or tightness. He is active walking 2 miles 3 days a week while at work in [employer] and walking his dog 2-3 times a day when at home.  -Continue aspirin, rosuvastatin. -Repeat lipid panel and LFTs today.  Hypertension BP today 134/80. Home BP typically 120-130/75-85.  -Continue amlodipine, olmesartan.  Disposition: Lipid panel and LFTs today. Return in 1 year or sooner as needed.          Signed, Etta Grandchild. Sherlynn Tourville, DNP, NP-C

## 2023-09-11 ENCOUNTER — Ambulatory Visit: Payer: 59 | Attending: Student | Admitting: Student

## 2023-09-11 ENCOUNTER — Encounter: Payer: Self-pay | Admitting: Student

## 2023-09-11 VITALS — BP 134/80 | HR 65 | Ht 72.0 in | Wt 281.0 lb

## 2023-09-11 DIAGNOSIS — R931 Abnormal findings on diagnostic imaging of heart and coronary circulation: Secondary | ICD-10-CM

## 2023-09-11 DIAGNOSIS — I10 Essential (primary) hypertension: Secondary | ICD-10-CM

## 2023-09-11 DIAGNOSIS — E785 Hyperlipidemia, unspecified: Secondary | ICD-10-CM | POA: Diagnosis not present

## 2023-09-11 NOTE — Patient Instructions (Signed)
 Medication Instructions:  No changes at this time.   *If you need a refill on your cardiac medications before your next appointment, please call your pharmacy*   Lab Work: Lipid & LFT to be done today.  If you have labs (blood work) drawn today and your tests are completely normal, you will receive your results only by: MyChart Message (if you have MyChart) OR A paper copy in the mail If you have any lab test that is abnormal or we need to change your treatment, we will call you to review the results.   Testing/Procedures: None   Follow-Up: At [facility], you and your health needs are our priority.  As part of our continuing mission to provide you with exceptional heart care, we have created designated Provider Care Teams.  These Care Teams include your primary Cardiologist (physician) and Advanced Practice Providers (APPs -  Physician Assistants and Nurse Practitioners) who all work together to provide you with the care you need, when you need it.   Your next appointment:   1 year(s)  Provider:   Debbe Odea, MD or Carlos Levering, NP

## 2023-09-12 LAB — HEPATIC FUNCTION PANEL
ALT: 25 IU/L (ref 0–44)
AST: 19 IU/L (ref 0–40)
Albumin: 4.4 g/dL (ref 3.8–4.9)
Alkaline Phosphatase: 29 IU/L — ABNORMAL LOW (ref 44–121)
Bilirubin Total: 0.2 mg/dL (ref 0.0–1.2)
Bilirubin, Direct: 0.11 mg/dL (ref 0.00–0.40)
Total Protein: 6.6 g/dL (ref 6.0–8.5)

## 2023-09-12 LAB — LIPID PANEL
Chol/HDL Ratio: 2.2 ratio (ref 0.0–5.0)
Cholesterol, Total: 103 mg/dL (ref 100–199)
HDL: 46 mg/dL (ref >39)
LDL Chol Calc (NIH): 44 mg/dL (ref 0–99)
Triglycerides: 59 mg/dL (ref 0–149)
VLDL Cholesterol Cal: 13 mg/dL (ref 5–40)

## 2023-10-11 ENCOUNTER — Ambulatory Visit: Payer: Self-pay | Admitting: Family Medicine

## 2023-10-12 ENCOUNTER — Ambulatory Visit: Payer: 59 | Admitting: Family Medicine

## 2023-10-13 ENCOUNTER — Encounter: Payer: Self-pay | Admitting: Family Medicine

## 2023-10-13 ENCOUNTER — Ambulatory Visit: Payer: 59 | Admitting: Family Medicine

## 2023-10-13 ENCOUNTER — Other Ambulatory Visit: Payer: Self-pay | Admitting: Family Medicine

## 2023-10-13 VITALS — BP 110/74 | HR 68 | Ht 72.0 in | Wt 279.0 lb

## 2023-10-13 DIAGNOSIS — E782 Mixed hyperlipidemia: Secondary | ICD-10-CM

## 2023-10-13 DIAGNOSIS — I10 Essential (primary) hypertension: Secondary | ICD-10-CM

## 2023-10-13 DIAGNOSIS — M1A9XX0 Chronic gout, unspecified, without tophus (tophi): Secondary | ICD-10-CM

## 2023-10-13 DIAGNOSIS — R7303 Prediabetes: Secondary | ICD-10-CM

## 2023-10-13 DIAGNOSIS — Z125 Encounter for screening for malignant neoplasm of prostate: Secondary | ICD-10-CM

## 2023-10-13 DIAGNOSIS — Z0000 Encounter for general adult medical examination without abnormal findings: Secondary | ICD-10-CM

## 2023-10-13 LAB — POCT GLYCOSYLATED HEMOGLOBIN (HGB A1C): Hemoglobin A1C: 6 % — AB (ref 4.0–5.6)

## 2023-10-13 NOTE — Patient Instructions (Addendum)
 Thank you for coming to the office today.  Continue Rosuvastatin Dramatic improved cholesterol May try muscle cramp remedies Consider alternating statin dose if needed for every other day in the future, or we can consider does adjustment.   DUE for FASTING BLOOD WORK (no food or drink after midnight before the lab appointment, only water or coffee without cream/sugar on the morning of)  SCHEDULE "Lab Only" visit in the morning at the clinic for lab draw in 6 MONTHS   - Make sure Lab Only appointment is at about 1 week before your next appointment, so that results will be available  For Lab Results, once available within 2-3 days of blood draw, you can can log in to MyChart online to view your results and a brief explanation. Also, we can discuss results at next follow-up visit.   Please schedule a Follow-up Appointment to: Return for 6 month fasting lab > 1 week later Annual Physical.  If you have any other questions or concerns, please feel free to call the office or send a message through MyChart. You may also schedule an earlier appointment if necessary.  Additionally, you may be receiving a survey about your experience at our office within a few days to 1 week by e-mail or mail. We value your feedback.  Saralyn Pilar, DO [facility], [place]

## 2023-10-13 NOTE — Progress Notes (Signed)
 Subjective:    Patient ID: Alan English, male    DOB: 26-Jun-1966, 58 y.o.   MRN: [id number]  Alan English is a 58 y.o. male presenting on 10/13/2023 for Pre-Diabetes   HPI  Discussed the use of AI scribe software for clinical note transcription with the patient, who gave verbal consent to proceed.  History of Present Illness   Alan English is a 58 year old male who presents for a follow-up visit.    CHRONIC HTN: BP well controlled  Current Meds - Amlodipine 10mg  daily, Olmesartan 40mg  daily Lifestyle: - Diet: Goal to improve - Exercise: Goal to improve Denies CP, dyspnea, HA, edema, dizziness / lightheadedness  No gout flares since previous visit. Very mild occasional symptoms but not actual flare.   Pre Diabetes / Elevated A1c Obesity BMI >37 He has improved diet overall, eliminating A1c down to 6.0, prior range 5.8 to 6.3 Weight down overall Similar to keto diet mostly complex carbs and protein, goal to add more vegetables Not on medication. Weight improved Continue exercise walking   HYPERLIPIDEMIA: He has been managing hyperlipidemia with lifestyle changes, including weight loss and dietary modifications. Since starting rosuvastatin in October after CT Coronary Scan, his LDL has reduced from 108 to 44. He experiences mild muscle cramping, particularly at night, which he attributes to the statin. These cramps are described as 'twinges' that occur when he moves his legs in bed, but they are not severe enough to require him to get out of bed. He is currently taking rosuvastatin and an 81 mg aspirin and has been consistent with his medication regimen. - On rosuvastatin 20mg  daily Lifestyle - Diet: improved diet low cholesterol - Exercise: activity exercise    Health Maintenance:   Colon CA Screening - Completed Cologuard 04/2022, negative, next 2026.  Defer Flu Shot until October 2024   Tdap and Shingrix vaccines due in future.       10/13/2023    8:15 AM  04/13/2023    8:57 AM 09/30/2022   10:57 AM  Depression screen PHQ 2/9  Decreased Interest 0 0 0  Down, Depressed, Hopeless 0 0 0  PHQ - 2 Score 0 0 0  Altered sleeping 0 0 1  Tired, decreased energy 0 0 0  Change in appetite 0 0 0  Feeling bad or failure about yourself  0 0 0  Trouble concentrating 0 0 0  Moving slowly or fidgety/restless 0 0 0  Suicidal thoughts 0 0 0  PHQ-9 Score 0 0 1  Difficult doing work/chores   Not difficult at all       10/13/2023    8:15 AM 04/13/2023    8:57 AM 09/30/2022   10:57 AM 04/01/2022    9:48 AM  GAD 7 : Generalized Anxiety Score  Nervous, Anxious, on Edge 0 0 0 0  Control/stop worrying 0 0 0 0  Worry too much - different things 0 0 0 0  Trouble relaxing 0 0 0 0  Restless 0 0 0 0  Easily annoyed or irritable 0 0 0 0  Afraid - awful might happen 0 0 0 0  Total GAD 7 Score 0 0 0 0  Anxiety Difficulty   Not difficult at all Not difficult at all    Social History   Tobacco Use   Smoking status: Never   Smokeless tobacco: Never  Vaping Use   Vaping status: Never Used  Substance Use Topics   Alcohol use: Yes    Alcohol/week:  1.0 standard drink of alcohol    Types: 1 Standard drinks or equivalent per week    Review of Systems Per HPI unless specifically indicated above     Objective:    BP 110/74 (BP Location: Right Arm, Patient Position: Sitting)   Pulse 68   Ht 6' (1.829 m)   Wt 279 lb (126.6 kg)   SpO2 99%   BMI 37.84 kg/m   Wt Readings from Last 3 Encounters:  10/13/23 279 lb (126.6 kg)  09/11/23 281 lb (127.5 kg)  07/03/23 274 lb 8 oz (124.5 kg)    Physical Exam Vitals and nursing note reviewed.  Constitutional:      General: He is not in acute distress.    Appearance: Normal appearance. He is well-developed. He is not diaphoretic.     Comments: Well-appearing, comfortable, cooperative  HENT:     Head: Normocephalic and atraumatic.  Eyes:     General:        Right eye: No discharge.        Left eye: No  discharge.     Conjunctiva/sclera: Conjunctivae normal.  Cardiovascular:     Rate and Rhythm: Normal rate.  Pulmonary:     Effort: Pulmonary effort is normal.  Skin:    General: Skin is warm and dry.     Findings: No erythema or rash.  Neurological:     Mental Status: He is alert and oriented to person, place, and time.  Psychiatric:        Mood and Affect: Mood normal.        Behavior: Behavior normal.        Thought Content: Thought content normal.     Comments: Well groomed, good eye contact, normal speech and thoughts     Results for orders placed or performed in visit on 10/13/23  POCT HgB A1C   Collection Time: 10/13/23  8:18 AM  Result Value Ref Range   Hemoglobin A1C 6.0 (A) 4.0 - 5.6 %   HbA1c POC (<> result, manual entry)     HbA1c, POC (prediabetic range)     HbA1c, POC (controlled diabetic range)        Assessment & Plan:   Problem List Items Addressed This Visit     Essential hypertension   Relevant Medications   rosuvastatin (CRESTOR) 20 MG tablet   Mixed hyperlipidemia   Relevant Medications   rosuvastatin (CRESTOR) 20 MG tablet   Morbid obesity (HCC)   Pre-diabetes - Primary   Relevant Orders   POCT HgB A1C (Completed)     Pre-Diabetes HbA1c at 6.0 indicates improvement. Effective management through weight loss and dietary changes. - Continue current diabetes management plan with focus on weight loss and dietary changes.  Hyperlipidemia LDL decreased to 44 with rosuvastatin. >50% reduction LDL Mild muscle cramping noted. Discussed intermittent dosing if cramps worsen. - Continue rosuvastatin 20mg  - Consider intermittent dosing if muscle cramps worsen. - Monitor cholesterol levels and adjust dose if necessary.  Gout Infrequent minor flares, well-managed.  General Health Maintenance Due for pneumonia and shingles vaccinations. Colon cancer screening in one year. Dermatology visit planned. - Administer pneumonia and shingles vaccinations when  ready. - Schedule colon cancer screening in one year. - Attend dermatology appointment for skin evaluation.        Orders Placed This Encounter  Procedures   POCT HgB A1C    No orders of the defined types were placed in this encounter.   Follow up plan: Return for 6  month fasting lab > 1 week later Annual Physical.  Future labs ordered for 04/22/24  Saralyn Pilar, DO [facility] 10/13/2023, 8:27 AM

## 2023-12-08 ENCOUNTER — Other Ambulatory Visit: Payer: Self-pay | Admitting: Family Medicine

## 2023-12-08 DIAGNOSIS — I10 Essential (primary) hypertension: Secondary | ICD-10-CM

## 2023-12-12 ENCOUNTER — Other Ambulatory Visit: Payer: Self-pay

## 2023-12-12 ENCOUNTER — Encounter: Payer: Self-pay | Admitting: Family Medicine

## 2023-12-12 ENCOUNTER — Other Ambulatory Visit: Payer: Self-pay | Admitting: Family Medicine

## 2023-12-12 DIAGNOSIS — I10 Essential (primary) hypertension: Secondary | ICD-10-CM

## 2023-12-12 MED ORDER — AMLODIPINE BESYLATE 10 MG PO TABS: 10.0000 mg | ORAL_TABLET | Freq: Every day | ORAL | 1 refills | Status: DC

## 2023-12-12 NOTE — Telephone Encounter (Signed)
 Requested Prescriptions  Refused Prescriptions Disp Refills   amLODipine  (NORVASC ) 10 MG tablet [Pharmacy Med Name: AMLODIPINE  BESYLATE 10MG TABLETS] 90 tablet 1    Sig: TAKE 1 TABLET(10 MG) BY MOUTH DAILY     Cardiovascular: Calcium  Channel Blockers 2 Passed - 12/12/2023 11:08 AM      Passed - Last BP in normal range    BP Readings from Last 1 Encounters:  10/13/23 110/74         Passed - Last Heart Rate in normal range    Pulse Readings from Last 1 Encounters:  10/13/23 68         Passed - Valid encounter within last 6 months    Recent Outpatient Visits           2 months ago Pre-diabetes   [place] [facility] [place], Kayleen Party, DO       Future Appointments             In 4 months Elta Halter, MD [facility]

## 2023-12-14 NOTE — Telephone Encounter (Signed)
 Requested Prescriptions  Refused Prescriptions Disp Refills   amLODipine  (NORVASC ) 10 MG tablet [Pharmacy Med Name: AMLODIPINE  BESYLATE 10MG TABLETS] 90 tablet 1    Sig: TAKE 1 TABLET(10 MG) BY MOUTH DAILY     Cardiovascular: Calcium  Channel Blockers 2 Passed - 12/14/2023  2:54 PM      Passed - Last BP in normal range    BP Readings from Last 1 Encounters:  10/13/23 110/74         Passed - Last Heart Rate in normal range    Pulse Readings from Last 1 Encounters:  10/13/23 68         Passed - Valid encounter within last 6 months    Recent Outpatient Visits           2 months ago Pre-diabetes   [facility] [place], Kayleen Party, DO       Future Appointments             In 4 months Elta Halter, MD [facility]

## 2024-04-17 ENCOUNTER — Other Ambulatory Visit: Payer: Self-pay | Admitting: Dermatology

## 2024-04-17 ENCOUNTER — Encounter: Payer: Self-pay | Admitting: Dermatology

## 2024-04-17 ENCOUNTER — Ambulatory Visit: Payer: Self-pay | Admitting: Dermatology

## 2024-04-17 DIAGNOSIS — W908XXA Exposure to other nonionizing radiation, initial encounter: Secondary | ICD-10-CM

## 2024-04-17 DIAGNOSIS — D2372 Other benign neoplasm of skin of left lower limb, including hip: Secondary | ICD-10-CM

## 2024-04-17 DIAGNOSIS — L578 Other skin changes due to chronic exposure to nonionizing radiation: Secondary | ICD-10-CM | POA: Diagnosis not present

## 2024-04-17 DIAGNOSIS — D492 Neoplasm of unspecified behavior of bone, soft tissue, and skin: Secondary | ICD-10-CM | POA: Diagnosis not present

## 2024-04-17 NOTE — Progress Notes (Signed)
   New Patient Visit   Subjective  Alan English is a 58 y.o. male who presents for the following: Spot on left leg near knee. Dur: >1 year. PCP recommended dermatology consult. No personal Hx of skin cancer.   The patient has spots, moles and lesions to be evaluated, some may be new or changing and the patient may have concern these could be cancer.  The following portions of the chart were reviewed this encounter and updated as appropriate: medications, allergies, medical history  Review of Systems:  No other skin or systemic complaints except as noted in HPI or Assessment and Plan.  Objective  Well appearing patient in no apparent distress; mood and affect are within normal limits.  A focused examination was performed of the following areas: Legs  Relevant physical exam findings are noted in the Assessment and Plan.  Left Lateral Popliteal 1.1 cm pink firm plaque    Assessment & Plan   ACTINIC DAMAGE - chronic, secondary to cumulative UV radiation exposure/sun exposure over time - diffuse scaly erythematous macules with underlying dyspigmentation - Recommend daily broad spectrum sunscreen SPF 30+ to sun-exposed areas, reapply every 2 hours as needed.  - Recommend staying in the shade or wearing long sleeves, sun glasses (UVA+UVB protection) and wide brim hats (4-inch brim around the entire circumference of the hat). - Call for new or changing lesions.    NEOPLASM OF SKIN Left Lateral Popliteal Epidermal / dermal shaving  Lesion diameter (cm):  1.1 Informed consent: discussed and consent obtained   Timeout: patient name, date of birth, surgical site, and procedure verified   Procedure prep:  Patient was prepped and draped in usual sterile fashion Prep type:  Isopropyl alcohol Anesthesia: the lesion was anesthetized in a standard fashion   Anesthetic:  1% lidocaine w/ epinephrine 1-100,000 buffered w/ 8.4% NaHCO3 Instrument used: flexible razor blade   Hemostasis  achieved with: pressure, aluminum chloride and electrodesiccation   Outcome: patient tolerated procedure well   Post-procedure details: sterile dressing applied and wound care instructions given   Dressing type: bandage and petrolatum    Destruction of lesion Complexity: extensive   Destruction method: electrodesiccation and curettage   Informed consent: discussed and consent obtained   Timeout:  patient name, date of birth, surgical site, and procedure verified Procedure prep:  Patient was prepped and draped in usual sterile fashion Prep type:  Isopropyl alcohol Anesthesia: the lesion was anesthetized in a standard fashion   Anesthetic:  1% lidocaine w/ epinephrine 1-100,000 buffered w/ 8.4% NaHCO3 Curettage performed in three different directions: Yes   Electrodesiccation performed over the curetted area: Yes   Curettage cycles:  3 Hemostasis achieved with:  pressure and aluminum chloride Outcome: patient tolerated procedure well with no complications   Post-procedure details: sterile dressing applied and wound care instructions given   Dressing type: bandage and petrolatum    Specimen 1 - Surgical pathology Differential Diagnosis: cyst R/O SCC vs other  Check Margins: No ACTINIC SKIN DAMAGE     Return for TBSE in 3-4 months.  I, Jill Parcell, CMA, am acting as scribe for Alm Rhyme, MD.   Documentation: I have reviewed the above documentation for accuracy and completeness, and I agree with the above.  Alm Rhyme, MD

## 2024-04-17 NOTE — Patient Instructions (Signed)

## 2024-04-18 ENCOUNTER — Encounter: Payer: Self-pay | Admitting: Dermatology

## 2024-04-19 ENCOUNTER — Other Ambulatory Visit

## 2024-04-19 LAB — DERMATOLOGY PATHOLOGY

## 2024-04-20 ENCOUNTER — Ambulatory Visit: Payer: Self-pay | Admitting: Dermatology

## 2024-04-22 ENCOUNTER — Other Ambulatory Visit

## 2024-04-22 DIAGNOSIS — Z125 Encounter for screening for malignant neoplasm of prostate: Secondary | ICD-10-CM

## 2024-04-22 DIAGNOSIS — I10 Essential (primary) hypertension: Secondary | ICD-10-CM

## 2024-04-22 DIAGNOSIS — R7303 Prediabetes: Secondary | ICD-10-CM

## 2024-04-22 DIAGNOSIS — E782 Mixed hyperlipidemia: Secondary | ICD-10-CM

## 2024-04-22 DIAGNOSIS — Z0000 Encounter for general adult medical examination without abnormal findings: Secondary | ICD-10-CM

## 2024-04-22 DIAGNOSIS — M1A9XX0 Chronic gout, unspecified, without tophus (tophi): Secondary | ICD-10-CM

## 2024-04-22 NOTE — Telephone Encounter (Addendum)
 Tried calling patient regarding bx results. No answer. Lm for patient to return call.----- Message from Alm Rhyme sent at 04/20/2024  2:00 PM EDT ----- FINAL DIAGNOSIS        1. Skin, left lateral popliteal :       SURFACE OF A DERMATOFIBROMA   Benign Dermatofibroma No further treatment needed ----- Message ----- From: Interface, Lab In Three Zero One Sent: 04/19/2024   4:32 PM EDT To: Alm JAYSON Rhyme, MD

## 2024-04-23 ENCOUNTER — Ambulatory Visit: Payer: Self-pay | Admitting: Family Medicine

## 2024-04-23 LAB — CBC WITH DIFFERENTIAL/PLATELET
Absolute Lymphocytes: 1789 {cells}/uL (ref 850–3900)
Absolute Monocytes: 551 {cells}/uL (ref 200–950)
Basophils Absolute: 52 {cells}/uL (ref 0–200)
Basophils Relative: 1 %
Eosinophils Absolute: 172 {cells}/uL (ref 15–500)
Eosinophils Relative: 3.3 %
HCT: 42.9 % (ref 38.5–50.0)
Hemoglobin: 13.9 g/dL (ref 13.2–17.1)
MCH: 29.5 pg (ref 27.0–33.0)
MCHC: 32.4 g/dL (ref 32.0–36.0)
MCV: 91.1 fL (ref 80.0–100.0)
MPV: 10.1 fL (ref 7.5–12.5)
Monocytes Relative: 10.6 %
Neutro Abs: 2636 {cells}/uL (ref 1500–7800)
Neutrophils Relative %: 50.7 %
Platelets: 241 Thousand/uL (ref 140–400)
RBC: 4.71 Million/uL (ref 4.20–5.80)
RDW: 12.8 % (ref 11.0–15.0)
Total Lymphocyte: 34.4 %
WBC: 5.2 Thousand/uL (ref 3.8–10.8)

## 2024-04-23 LAB — HEMOGLOBIN A1C
Hgb A1c MFr Bld: 6.1 % — ABNORMAL HIGH (ref <5.7)
Mean Plasma Glucose: 128 mg/dL
eAG (mmol/L): 7.1 mmol/L

## 2024-04-23 LAB — COMPLETE METABOLIC PANEL WITHOUT GFR
AG Ratio: 1.8 (calc) (ref 1.0–2.5)
ALT: 23 U/L (ref 9–46)
AST: 17 U/L (ref 10–35)
Albumin: 4.5 g/dL (ref 3.6–5.1)
Alkaline phosphatase (APISO): 21 U/L — ABNORMAL LOW (ref 35–144)
BUN: 14 mg/dL (ref 7–25)
CO2: 27 mmol/L (ref 20–32)
Calcium: 10 mg/dL (ref 8.6–10.3)
Chloride: 105 mmol/L (ref 98–110)
Creat: 1.1 mg/dL (ref 0.70–1.30)
Globulin: 2.5 g/dL (ref 1.9–3.7)
Glucose, Bld: 106 mg/dL — ABNORMAL HIGH (ref 65–99)
Potassium: 4.7 mmol/L (ref 3.5–5.3)
Sodium: 140 mmol/L (ref 135–146)
Total Bilirubin: 0.6 mg/dL (ref 0.2–1.2)
Total Protein: 7 g/dL (ref 6.1–8.1)

## 2024-04-23 LAB — LIPID PANEL
Cholesterol: 92 mg/dL (ref <200)
HDL: 41 mg/dL (ref >=40)
LDL Cholesterol (Calc): 31 mg/dL
Non-HDL Cholesterol (Calc): 51 mg/dL (ref <130)
Total CHOL/HDL Ratio: 2.2 (calc) (ref <5.0)
Triglycerides: 110 mg/dL (ref <150)

## 2024-04-23 LAB — PSA: PSA: 0.25 ng/mL (ref <=4.00)

## 2024-04-23 LAB — URIC ACID: Uric Acid, Serum: 7 mg/dL (ref 4.0–8.0)

## 2024-04-23 NOTE — Telephone Encounter (Addendum)
 Called and discussed bx results with patient. He verbalized understanding and denied further questions.----- Message from Alm Rhyme sent at 04/20/2024  2:00 PM EDT ----- FINAL DIAGNOSIS        1. Skin, left lateral popliteal :       SURFACE OF A DERMATOFIBROMA   Benign Dermatofibroma No further treatment needed ----- Message ----- From: Interface, Lab In Three Zero One Sent: 04/19/2024   4:32 PM EDT To: Alm JAYSON Rhyme, MD

## 2024-04-29 ENCOUNTER — Ambulatory Visit: Admitting: Family Medicine

## 2024-04-29 ENCOUNTER — Encounter: Admitting: Family Medicine

## 2024-04-29 ENCOUNTER — Encounter: Payer: Self-pay | Admitting: Family Medicine

## 2024-04-29 VITALS — BP 130/80 | HR 66 | Ht 72.0 in | Wt 288.4 lb

## 2024-04-29 DIAGNOSIS — R7303 Prediabetes: Secondary | ICD-10-CM

## 2024-04-29 DIAGNOSIS — Z0000 Encounter for general adult medical examination without abnormal findings: Secondary | ICD-10-CM

## 2024-04-29 DIAGNOSIS — E782 Mixed hyperlipidemia: Secondary | ICD-10-CM | POA: Diagnosis not present

## 2024-04-29 DIAGNOSIS — M1A9XX0 Chronic gout, unspecified, without tophus (tophi): Secondary | ICD-10-CM

## 2024-04-29 DIAGNOSIS — Z23 Encounter for immunization: Secondary | ICD-10-CM

## 2024-04-29 DIAGNOSIS — I10 Essential (primary) hypertension: Secondary | ICD-10-CM

## 2024-04-29 MED ORDER — AMLODIPINE BESYLATE 10 MG PO TABS: 10.0000 mg | ORAL_TABLET | Freq: Every day | ORAL | 3 refills | Status: AC

## 2024-04-29 MED ORDER — ROSUVASTATIN CALCIUM 20 MG PO TABS: 20.0000 mg | ORAL_TABLET | Freq: Every day | ORAL | 3 refills | Status: AC

## 2024-04-29 NOTE — Patient Instructions (Addendum)
 Thank you for coming to the office today.  Refills added today.  Keep up the great work  [employer] today  Prevnar-20 vaccine today   Please schedule a Follow-up Appointment to: Return for 1 year fasting lab > 1 week later Annual Physical.  If you have any other questions or concerns, please feel free to call the office or send a message through MyChart. You may also schedule an earlier appointment if necessary.  Additionally, you may be receiving a survey about your experience at our office within a few days to 1 week by e-mail or mail. We value your feedback.  Marsa Officer, DO [facility], [place]

## 2024-04-29 NOTE — Progress Notes (Unsigned)
 Subjective:    Patient ID: Alan English, male    DOB: [birth year], 58 y.o.   MRN: [id number]  Alan English is a 58 y.o. male presenting on 04/29/2024 for Annual Exam   HPI  Discussed the use of AI scribe software for clinical note transcription with the patient, who gave verbal consent to proceed.  History of Present Illness   CHRONIC HTN: BP well controlled  Current Meds - Amlodipine  10mg  daily, Olmesartan  40mg  daily Lifestyle: - Diet: Goal to improve - Exercise: Goal to improve Denies CP, dyspnea, HA, edema, dizziness / lightheadedness   No gout flares since previous visit. Very mild occasional symptoms but not actual flare.   Pre Diabetes / Elevated A1c Obesity BMI >39 He has improved diet overall, eliminating A1c 6.1, stable from 5.9 to 6.1 Weight down overall Similar to keto diet mostly complex carbs and protein, goal to add more vegetables Not on medication. Weight improved Continue exercise walking   HYPERLIPIDEMIA: He has been managing hyperlipidemia with lifestyle changes, including weight loss and dietary modifications. Since starting rosuvastatin  in October after CT Coronary Scan, his LDL has reduced from 108 to 44. He experiences mild muscle cramping, particularly at night, which he attributes to the statin. These cramps are described as 'twinges' that occur when he moves his legs in bed, but they are not severe enough to require him to get out of bed. He is currently taking rosuvastatin  and an 81 mg aspirin and has been consistent with his medication regimen. - On rosuvastatin  20mg  daily Lifestyle - Diet: improved diet low cholesterol - Exercise: activity exercise  [facility] [place], biopsy removal skin tag, dermatofibroma ***     Health Maintenance:  PSA 0.25 negative   Colon CA Screening - Completed Cologuard 04/2022, negative, next 2026.  Tdap and Shingrix vaccines due in future.  Future Prevnar-20  COVID  Flu Shot  today     10/13/2023    8:15 AM 04/13/2023    8:57 AM 09/30/2022   10:57 AM  Depression screen PHQ 2/9  Decreased Interest 0 0 0  Down, Depressed, Hopeless 0 0 0  PHQ - 2 Score 0 0 0  Altered sleeping 0 0 1  Tired, decreased energy 0 0 0  Change in appetite 0 0 0  Feeling bad or failure about yourself  0 0 0  Trouble concentrating 0 0 0  Moving slowly or fidgety/restless 0 0 0  Suicidal thoughts 0 0 0  PHQ-9 Score 0 0 1  Difficult doing work/chores   Not difficult at all       10/13/2023    8:15 AM 04/13/2023    8:57 AM 09/30/2022   10:57 AM 04/01/2022    9:48 AM  GAD 7 : Generalized Anxiety Score  Nervous, Anxious, on Edge 0 0 0 0  Control/stop worrying 0 0 0 0  Worry too much - different things 0 0 0 0  Trouble relaxing 0 0 0 0  Restless 0 0 0 0  Easily annoyed or irritable 0 0 0 0  Afraid - awful might happen 0 0 0 0  Total GAD 7 Score 0 0 0 0  Anxiety Difficulty   Not difficult at all Not difficult at all     Past Medical History:  Diagnosis Date   Gout    Hypertension    History reviewed. No pertinent surgical history. Social History   Socioeconomic History   Marital status: Married    Spouse name:  Not on file   Number of children: Not on file   Years of education: Not on file   Highest education level: Master's degree (e.g., MA, MS, MEng, MEd, MSW, MBA)  Occupational History   Not on file  Tobacco Use   Smoking status: Never   Smokeless tobacco: Never  Vaping Use   Vaping status: Never Used  Substance and Sexual Activity   Alcohol use: Yes    Alcohol/week: 1.0 standard drink of alcohol    Types: 1 Standard drinks or equivalent per week   Drug use: Not on file   Sexual activity: Not on file  Other Topics Concern   Not on file  Social History Narrative   Not on file   Social Drivers of Health   Financial Resource Strain: Low Risk  (04/28/2024)   Overall Financial Resource Strain (CARDIA)    Difficulty of Paying Living Expenses: Not very hard   Food Insecurity: No Food Insecurity (04/28/2024)   Hunger Vital Sign    Worried About Running Out of Food in the Last Year: Never true    Ran Out of Food in the Last Year: Never true  Transportation Needs: No Transportation Needs (04/28/2024)   PRAPARE - Administrator, Civil Service (Medical): No    Lack of Transportation (Non-Medical): No  Physical Activity: Sufficiently Active (04/28/2024)   Exercise Vital Sign    Days of Exercise per Week: 5 days    Minutes of Exercise per Session: 40 min  Stress: No Stress Concern Present (04/28/2024)   [employer] of Occupational Health - Occupational Stress Questionnaire    Feeling of Stress: Not at all  Social Connections: Socially Integrated (04/28/2024)   Social Connection and Isolation Panel    Frequency of Communication with Friends and Family: Three times a week    Frequency of Social Gatherings with Friends and Family: Once a week    Attends Religious Services: More than 4 times per year    Active Member of [employer] or Organizations: Yes    Attends Engineer, structural: More than 4 times per year    Marital Status: Married  Catering manager Violence: Not on file   Family History  Problem Relation Age of Onset   Heart attack Maternal Grandfather 65   Heart attack Paternal Grandfather 68   Current Outpatient Medications on File Prior to Visit  Medication Sig   aspirin EC 81 MG tablet Take 81 mg by mouth daily. Swallow whole.   colchicine  0.6 MG tablet TAKE 1 TABLET BY MOUTH AS DIRECTED, MAY REPEAT DOSE ON DAY 1, THEN TAKE 1 TABLET DAILY FOR UP TO 7 TO 10 DAYS AS NEEDED FOR GOUT FLARE UNTIL RESOLVED   Multiple Vitamins-Minerals ([facility] MACULAR HEALTH PO) Take by mouth.   multivitamin-iron-minerals-folic acid (CENTRUM) chewable tablet Chew 1 tablet by mouth daily.   olmesartan  (BENICAR ) 40 MG tablet Take 1 tablet (40 mg total) by mouth daily.   No current facility-administered medications on file prior to visit.     Review of Systems Per HPI unless specifically indicated above     Objective:    BP 130/80 (BP Location: Right Arm, Patient Position: Sitting, Cuff Size: Large)   Pulse 66   Ht 6' (1.829 m)   Wt 288 lb 6 oz (130.8 kg)   SpO2 94%   BMI 39.11 kg/m   Wt Readings from Last 3 Encounters:  04/29/24 288 lb 6 oz (130.8 kg)  10/13/23 279 lb (126.6 kg)  09/11/23 281 lb (127.5 kg)    Physical Exam  Results for orders placed or performed in visit on 04/22/24  Uric acid   Collection Time: 04/22/24 10:02 AM  Result Value Ref Range   Uric Acid, Serum 7.0 4.0 - 8.0 mg/dL  PSA   Collection Time: 04/22/24 10:02 AM  Result Value Ref Range   PSA 0.25 < OR = 4.00 ng/mL  CBC with Differential/Platelet   Collection Time: 04/22/24 10:02 AM  Result Value Ref Range   WBC 5.2 3.8 - 10.8 Thousand/uL   RBC 4.71 4.20 - 5.80 Million/uL   Hemoglobin 13.9 13.2 - 17.1 g/dL   HCT 57.0 61.4 - 49.9 %   MCV 91.1 80.0 - 100.0 fL   MCH 29.5 27.0 - 33.0 pg   MCHC 32.4 32.0 - 36.0 g/dL   RDW 87.1 88.9 - 84.9 %   Platelets 241 140 - 400 Thousand/uL   MPV 10.1 7.5 - 12.5 fL   Neutro Abs 2,636 1,500 - 7,800 cells/uL   Absolute Lymphocytes 1,789 850 - 3,900 cells/uL   Absolute Monocytes 551 200 - 950 cells/uL   Eosinophils Absolute 172 15 - 500 cells/uL   Basophils Absolute 52 0 - 200 cells/uL   Neutrophils Relative % 50.7 %   Total Lymphocyte 34.4 %   Monocytes Relative 10.6 %   Eosinophils Relative 3.3 %   Basophils Relative 1.0 %  COMPLETE METABOLIC PANEL WITHOUT GFR   Collection Time: 04/22/24 10:02 AM  Result Value Ref Range   Glucose, Bld 106 (H) 65 - 99 mg/dL   BUN 14 7 - 25 mg/dL   Creat 8.89 9.29 - 8.69 mg/dL   BUN/Creatinine Ratio SEE NOTE: 6 - 22 (calc)   Sodium 140 135 - 146 mmol/L   Potassium 4.7 3.5 - 5.3 mmol/L   Chloride 105 98 - 110 mmol/L   CO2 27 20 - 32 mmol/L   Calcium  10.0 8.6 - 10.3 mg/dL   Total Protein 7.0 6.1 - 8.1 g/dL   Albumin 4.5 3.6 - 5.1 g/dL   Globulin 2.5  1.9 - 3.7 g/dL (calc)   AG Ratio 1.8 1.0 - 2.5 (calc)   Total Bilirubin 0.6 0.2 - 1.2 mg/dL   Alkaline phosphatase (APISO) 21 (L) 35 - 144 U/L   AST 17 10 - 35 U/L   ALT 23 9 - 46 U/L  Hemoglobin A1c   Collection Time: 04/22/24 10:02 AM  Result Value Ref Range   Hgb A1c MFr Bld 6.1 (H) <5.7 %   Mean Plasma Glucose 128 mg/dL   eAG (mmol/L) 7.1 mmol/L  Lipid panel   Collection Time: 04/22/24 10:02 AM  Result Value Ref Range   Cholesterol 92 <200 mg/dL   HDL 41 > OR = 40 mg/dL   Triglycerides 889 <849 mg/dL   LDL Cholesterol (Calc) 31 mg/dL (calc)   Total CHOL/HDL Ratio 2.2 <5.0 (calc)   Non-HDL Cholesterol (Calc) 51 <869 mg/dL (calc)      Assessment & Plan:   Problem List Items Addressed This Visit     Chronic gout involving toe of right foot without tophus   Essential hypertension   Relevant Medications   rosuvastatin  (CRESTOR ) 20 MG tablet   amLODipine  (NORVASC ) 10 MG tablet   Mixed hyperlipidemia   Relevant Medications   rosuvastatin  (CRESTOR ) 20 MG tablet   amLODipine  (NORVASC ) 10 MG tablet   Morbid obesity (HCC)   Pre-diabetes   Other Visit Diagnoses       Annual physical  exam    -  Primary     Flu vaccine need       Relevant Orders   Flu vaccine trivalent PF, 6mos and older(Flulaval,Afluria,Fluarix,Fluzone) (Completed)     Need for Streptococcus pneumoniae vaccination       Relevant Orders   Pneumococcal conjugate vaccine 20-valent        Updated Health Maintenance information Reviewed recent lab results with patient Encouraged improvement to lifestyle with diet and exercise Goal of weight loss  Assessment and Plan Assessment & Plan      Orders Placed This Encounter  Procedures   Flu vaccine trivalent PF, 6mos and older(Flulaval,Afluria,Fluarix,Fluzone)   Pneumococcal conjugate vaccine 20-valent    Meds ordered this encounter  Medications   rosuvastatin  (CRESTOR ) 20 MG tablet    Sig: Take 1 tablet (20 mg total) by mouth at bedtime.     Dispense:  90 tablet    Refill:  3    Add future refills when ready   amLODipine  (NORVASC ) 10 MG tablet    Sig: Take 1 tablet (10 mg total) by mouth daily.    Dispense:  90 tablet    Refill:  3    Add future refills     Follow up plan: Return for 1 year fasting lab > 1 week later Annual Physical.  Marsa Officer, DO [facility] 04/29/2024, 2:41 PM

## 2024-04-30 ENCOUNTER — Other Ambulatory Visit: Payer: Self-pay | Admitting: Family Medicine

## 2024-04-30 DIAGNOSIS — Z125 Encounter for screening for malignant neoplasm of prostate: Secondary | ICD-10-CM

## 2024-04-30 DIAGNOSIS — M1A9XX0 Chronic gout, unspecified, without tophus (tophi): Secondary | ICD-10-CM

## 2024-04-30 DIAGNOSIS — Z0000 Encounter for general adult medical examination without abnormal findings: Secondary | ICD-10-CM

## 2024-04-30 DIAGNOSIS — R7303 Prediabetes: Secondary | ICD-10-CM

## 2024-04-30 DIAGNOSIS — E782 Mixed hyperlipidemia: Secondary | ICD-10-CM

## 2024-04-30 DIAGNOSIS — I10 Essential (primary) hypertension: Secondary | ICD-10-CM

## 2024-06-16 ENCOUNTER — Other Ambulatory Visit: Payer: Self-pay | Admitting: Family Medicine

## 2024-06-16 DIAGNOSIS — I10 Essential (primary) hypertension: Secondary | ICD-10-CM

## 2024-06-17 ENCOUNTER — Other Ambulatory Visit: Payer: Self-pay | Admitting: Family Medicine

## 2024-06-17 DIAGNOSIS — I10 Essential (primary) hypertension: Secondary | ICD-10-CM

## 2024-06-19 NOTE — Telephone Encounter (Signed)
 Requested Prescriptions  Pending Prescriptions Disp Refills   olmesartan  (BENICAR ) 40 MG tablet [Pharmacy Med Name: OLMESARTAN  MEDOXOMIL 40MG  TABLETS] 90 tablet 1    Sig: TAKE 1 TABLET(40 MG) BY MOUTH DAILY     Cardiovascular:  Angiotensin Receptor Blockers Passed - 06/19/2024  1:48 PM      Passed - Cr in normal range and within 180 days    Creat  Date Value Ref Range Status  04/22/2024 1.10 0.70 - 1.30 mg/dL Final         Passed - K in normal range and within 180 days    Potassium  Date Value Ref Range Status  04/22/2024 4.7 3.5 - 5.3 mmol/L Final         Passed - Patient is not pregnant      Passed - Last BP in normal range    BP Readings from Last 1 Encounters:  04/29/24 130/80         Passed - Valid encounter within last 6 months    Recent Outpatient Visits           1 month ago Annual physical exam   [facility] [place], Marsa PARAS, DO   8 months ago Pre-diabetes   [facility] [place] Marsa PARAS, DO       Future Appointments             In 1 month [place] Alm BROCKS, MD [facility]

## 2024-06-20 NOTE — Telephone Encounter (Signed)
 Too soon for refill, [facility] 04/29/24 FOR 90/1.  Requested Prescriptions  Pending Prescriptions Disp Refills   amLODipine  (NORVASC ) 10 MG tablet [Pharmacy Med Name: AMLODIPINE  BESYLATE 10MG TABLETS] 90 tablet 3    Sig: TAKE 1 TABLET(10 MG) BY MOUTH DAILY     Cardiovascular: Calcium  Channel Blockers 2 Passed - 06/20/2024 11:05 AM      Passed - Last BP in normal range    BP Readings from Last 1 Encounters:  04/29/24 130/80         Passed - Last Heart Rate in normal range    Pulse Readings from Last 1 Encounters:  04/29/24 66         Passed - Valid encounter within last 6 months    Recent Outpatient Visits           1 month ago Annual physical exam   [facility] [place], Marsa PARAS, DO   8 months ago Pre-diabetes   [place] [facility] [place] Marsa PARAS, DO       Future Appointments             In 1 month [place] Alm BROCKS, MD [facility]

## 2024-07-30 ENCOUNTER — Ambulatory Visit: Admitting: Dermatology

## 2024-07-30 ENCOUNTER — Encounter: Payer: Self-pay | Admitting: Dermatology

## 2024-07-30 DIAGNOSIS — L814 Other melanin hyperpigmentation: Secondary | ICD-10-CM | POA: Diagnosis not present

## 2024-07-30 DIAGNOSIS — L578 Other skin changes due to chronic exposure to nonionizing radiation: Secondary | ICD-10-CM

## 2024-07-30 DIAGNOSIS — L820 Inflamed seborrheic keratosis: Secondary | ICD-10-CM | POA: Diagnosis not present

## 2024-07-30 DIAGNOSIS — Z1283 Encounter for screening for malignant neoplasm of skin: Secondary | ICD-10-CM | POA: Diagnosis not present

## 2024-07-30 DIAGNOSIS — L570 Actinic keratosis: Secondary | ICD-10-CM

## 2024-07-30 DIAGNOSIS — L821 Other seborrheic keratosis: Secondary | ICD-10-CM | POA: Diagnosis not present

## 2024-07-30 DIAGNOSIS — D1801 Hemangioma of skin and subcutaneous tissue: Secondary | ICD-10-CM

## 2024-07-30 DIAGNOSIS — Z808 Family history of malignant neoplasm of other organs or systems: Secondary | ICD-10-CM

## 2024-07-30 DIAGNOSIS — D2372 Other benign neoplasm of skin of left lower limb, including hip: Secondary | ICD-10-CM | POA: Diagnosis not present

## 2024-07-30 DIAGNOSIS — W908XXA Exposure to other nonionizing radiation, initial encounter: Secondary | ICD-10-CM | POA: Diagnosis not present

## 2024-07-30 DIAGNOSIS — D239 Other benign neoplasm of skin, unspecified: Secondary | ICD-10-CM

## 2024-07-30 DIAGNOSIS — D229 Melanocytic nevi, unspecified: Secondary | ICD-10-CM

## 2024-07-30 NOTE — Progress Notes (Signed)
 "  Follow-Up Visit   Subjective  Alan English is a 59 y.o. male who presents for the following: Skin Cancer Screening and Full Body Skin Exam, Mother with hx of BCC, SCCs  The patient presents for Total-Body Skin Exam (TBSE) for skin cancer screening and mole check. The patient has spots, moles and lesions to be evaluated, some may be new or changing and the patient may have concern these could be cancer.  The following portions of the chart were reviewed this encounter and updated as appropriate: medications, allergies, medical history  Review of Systems:  No other skin or systemic complaints except as noted in HPI or Assessment and Plan.  Objective  Well appearing patient in no apparent distress; mood and affect are within normal limits.  A full examination was performed including scalp, head, eyes, ears, nose, lips, neck, chest, axillae, abdomen, back, buttocks, bilateral upper extremities, bilateral lower extremities, hands, feet, fingers, toes, fingernails, and toenails. All findings within normal limits unless otherwise noted below.   Relevant physical exam findings are noted in the Assessment and Plan.  L earlobe x 1, R medial forehead x 1, glabella x 1 (3) Pink scaly macules  R hand dorsum x 2, L hand dorsum x 1 (3) Stuck on waxy paps with erythema  Assessment & Plan   SKIN CANCER SCREENING PERFORMED TODAY.  ACTINIC DAMAGE - Chronic condition, secondary to cumulative UV/sun exposure - diffuse scaly erythematous macules with underlying dyspigmentation - Recommend daily broad spectrum sunscreen SPF 30+ to sun-exposed areas, reapply every 2 hours as needed.  - Staying in the shade or wearing long sleeves, sun glasses (UVA+UVB protection) and wide brim hats (4-inch brim around the entire circumference of the hat) are also recommended for sun protection.  - Call for new or changing lesions.  LENTIGINES, SEBORRHEIC KERATOSES, HEMANGIOMAS - Benign normal skin lesions -  Benign-appearing - Call for any changes  MELANOCYTIC NEVI - Tan-brown and/or pink-flesh-colored symmetric macules and papules - Benign appearing on exam today - Observation - Call clinic for new or changing moles - Recommend daily use of broad spectrum spf 30+ sunscreen to sun-exposed areas.   FAMILY HISTORY OF SKIN CANCER What type(s): BCC, SCCs Who affected: mother   DERMATOFIBROMA Bx proven 04/17/24 L lat popliteal Exam: Firm pink/brown papulenodule with dimple sign. Treatment Plan: A dermatofibroma is a benign growth possibly related to trauma, such as an insect bite, cut from shaving, or inflamed acne-type bump.  Treatment options to remove include shave or excision with resulting scar and risk of recurrence.  Since benign-appearing and not bothersome, will observe for now.   AK (ACTINIC KERATOSIS) (3) L earlobe x 1, R medial forehead x 1, glabella x 1 (3)   Actinic keratoses are precancerous spots that appear secondary to cumulative UV radiation exposure/sun exposure over time. They are chronic with expected duration over 1 year. A portion of actinic keratoses will progress to squamous cell carcinoma of the skin. It is not possible to reliably predict which spots will progress to skin cancer and so treatment is recommended to prevent development of skin cancer.  Recommend daily broad spectrum sunscreen SPF 30+ to sun-exposed areas, reapply every 2 hours as needed.  Recommend staying in the shade or wearing long sleeves, sun glasses (UVA+UVB protection) and wide brim hats (4-inch brim around the entire circumference of the hat). Call for new or changing lesions. - Destruction of lesion - L earlobe x 1, R medial forehead x 1, glabella x 1 (3)  Complexity: simple   Destruction method: cryotherapy   Informed consent: discussed and consent obtained   Timeout:  patient name, date of birth, surgical site, and procedure verified Lesion destroyed using liquid nitrogen: Yes   Region  frozen until ice ball extended beyond lesion: Yes   Outcome: patient tolerated procedure well with no complications   Post-procedure details: wound care instructions given    INFLAMED SEBORRHEIC KERATOSIS (3) R hand dorsum x 2, L hand dorsum x 1 (3) Symptomatic, irritating, patient would like treated. - Destruction of lesion - R hand dorsum x 2, L hand dorsum x 1 (3) Complexity: simple   Destruction method: cryotherapy   Informed consent: discussed and consent obtained   Timeout:  patient name, date of birth, surgical site, and procedure verified Lesion destroyed using liquid nitrogen: Yes   Region frozen until ice ball extended beyond lesion: Yes   Outcome: patient tolerated procedure well with no complications   Post-procedure details: wound care instructions given     Return for 4-44m AK f/u see photos.  I, Grayce Saunas, RMA, am acting as scribe for Alm Rhyme, MD .   Documentation: I have reviewed the above documentation for accuracy and completeness, and I agree with the above.  Alm Rhyme, MD    "

## 2024-07-30 NOTE — Patient Instructions (Addendum)
 Actinic Keratosis  What is an actinic keratosis? An actinic keratosis (plural: actinic keratoses) is growth on the surface of the skin that usually appears as a red, hard, crusty or scaly bump.   What causes actinic keratoses? Repeated prolonged sun exposure causes skin damage, especially in fair-skinned persons. Sun-damaged skin becomes dry and wrinkled and may form rough, scaly spots called actinic keratoses. These rough spots remain on the skin even though the crust or scale on top is picked off.   Why treat actinic keratoses? Actinic keratoses are not skin cancers, but because they may sometimes turn cancerous they are called pre-cancerous. Not all will turn to skin cancer, and it usually takes several years for this to happen. Because it is much easier to treat an actinic keratosis then it is to remove a skin cancer, actinic keratoses should be treated to prevent future skin cancer.   How are actinic keratoses treated? The most common way of treating actinic keratoses is to freeze them with liquid nitrogen. Freezing causes scabbing and shedding of the sun-damaged skin. Healing after a removal usually takes two weeks, depending on the size and location of the keratosis. Hands and legs heal more slowly than the face. The skins final appearance is usually excellent. There are several topical medications that can be used to treat actinic keratoses. These medications generally have side effects of redness, crusting, and pain. Some are used for a few days, and some for several months before the actinic keratosis is completely gone. Photodynamic therapy is another alternative to freezing actinic keratoses. This treatment is done in a physicians office. A medication is applied to the area of skin with actinic keratoses, and it is allowed to soak in for one or more hours. A special light is then applied to the skin. Side effects include redness, burning, and peeling.  How can you prevent actinic  keratoses? Protection from the sun is the best way to prevent actinic keratoses. The use of proper clothing and sunscreens can prevent the sun damage that leads to an actinic keratosis.  Unfortunately, some sun damage is permanent. Once sun damage has progressed to the point where actinic keratoses develop, new keratoses may appear even without further sun exposure. However, even in skin that is already heavily sun damaged, good sun protection can help reduce the number of actinic keratoses that will appear.   Cryotherapy Aftercare  Wash gently with soap and water everyday.   Apply Vaseline and Band-Aid daily until healed.    Seborrheic Keratosis  What causes seborrheic keratoses? Seborrheic keratoses are harmless, common skin growths that first appear during adult life.  As time goes by, more growths appear.  Some people may develop a large number of them.  Seborrheic keratoses appear on both covered and uncovered body parts.  They are not caused by sunlight.  The tendency to develop seborrheic keratoses can be inherited.  They vary in color from skin-colored to gray, brown, or even black.  They can be either smooth or have a rough, warty surface.   Seborrheic keratoses are superficial and look as if they were stuck on the skin.  Under the microscope this type of keratosis looks like layers upon layers of skin.  That is why at times the top layer may seem to fall off, but the rest of the growth remains and re-grows.    Treatment Seborrheic keratoses do not need to be treated, but can easily be removed in the office.  Seborrheic keratoses often cause symptoms  when they rub on clothing or jewelry.  Lesions can be in the way of shaving.  If they become inflamed, they can cause itching, soreness, or burning.  Removal of a seborrheic keratosis can be accomplished by freezing, burning, or surgery. If any spot bleeds, scabs, or grows rapidly, please return to have it checked, as these can be an indication  of a skin cancer.  Due to recent changes in healthcare laws, you may see results of your pathology and/or laboratory studies on MyChart before the doctors have had a chance to review them. We understand that in some cases there may be results that are confusing or concerning to you. Please understand that not all results are received at the same time and often the doctors may need to interpret multiple results in order to provide you with the best plan of care or course of treatment. Therefore, we ask that you please give us  2 business days to thoroughly review all your results before contacting the office for clarification. Should we see a critical lab result, you will be contacted sooner.   If You Need Anything After Your Visit  If you have any questions or concerns for your doctor, please call our main line at [phone] and press option 4 to reach your doctor's medical assistant. If no one answers, please leave a voicemail as directed and we will return your call as soon as possible. Messages left after 4 pm will be answered the following business day.   You may also send us  a message via MyChart. We typically respond to MyChart messages within 1-2 business days.  For prescription refills, please ask your pharmacy to contact our office. Our fax number is [phone].  If you have an urgent issue when the clinic is closed that cannot wait until the next business day, you can page your doctor at the number below.    Please note that while we do our best to be available for urgent issues outside of office hours, we are not available 24/7.   If you have an urgent issue and are unable to reach us , you may choose to seek medical care at your doctor's office, retail clinic, urgent care center, or emergency room.  If you have a medical emergency, please immediately call 911 or go to the emergency department.  Pager Numbers  - Dr. Hester: [phone]  - Dr. Jackquline: [phone]  - Dr.  Claudene: [phone]   - Dr. Raymund: [phone]  In the event of inclement weather, please call our main line at [phone] for an update on the status of any delays or closures.  Dermatology Medication Tips: Please keep the boxes that topical medications come in in order to help keep track of the instructions about where and how to use these. Pharmacies typically print the medication instructions only on the boxes and not directly on the medication tubes.   If your medication is too expensive, please contact our office at [phone] option 4 or send us  a message through MyChart.   We are unable to tell what your co-pay for medications will be in advance as this is different depending on your insurance coverage. However, we may be able to find a substitute medication at lower cost or fill out paperwork to get insurance to cover a needed medication.   If a prior authorization is required to get your medication covered by your insurance company, please allow us  1-2 business days to complete this process.  Drug prices often vary depending  on where the prescription is filled and some pharmacies may offer cheaper prices.  The website www.goodrx.com contains coupons for medications through different pharmacies. The prices here do not account for what the cost may be with help from insurance (it may be cheaper with your insurance), but the website can give you the price if you did not use any insurance.  - You can print the associated coupon and take it with your prescription to the pharmacy.  - You may also stop by our office during regular business hours and pick up a GoodRx coupon card.  - If you need your prescription sent electronically to a different pharmacy, notify our office through [facility] or by phone at [phone] option 4.     Si Usted Necesita Algo Despus de Su Visita  Tambin puede enviarnos un mensaje a travs de Clinical Cytogeneticist. Por lo general respondemos a los mensajes  de MyChart en el transcurso de 1 a 2 das hbiles.  Para renovar recetas, por favor pida a su farmacia que se ponga en contacto con nuestra oficina. Randi lakes de fax es [place] [phone].  Si tiene un asunto urgente cuando la clnica est cerrada y que no puede esperar hasta el siguiente da hbil, puede llamar/localizar a su doctor(a) al nmero que aparece a continuacin.   Por favor, tenga en cuenta que aunque hacemos todo lo posible para estar disponibles para asuntos urgentes fuera del horario de [place], no estamos disponibles las 24 horas del da, los 7 [address] de la [place].   Si tiene un problema urgente y no puede comunicarse con nosotros, puede optar por buscar atencin mdica  en el consultorio de su doctor(a), en una clnica privada, en un centro de atencin urgente o en una sala de emergencias.  Si tiene engineer, drilling, por favor llame inmediatamente al 911 o vaya a la sala de emergencias.  Nmeros de bper  - Dr. Hester: [phone]  - Dra. Jackquline: [phone]  - Dr. Claudene: [phone]  - Dra. Kitts: [phone]  En caso de inclemencias del [place], por favor llame a nuestra lnea principal al [phone] para una actualizacin sobre el estado de cualquier retraso o cierre.  Consejos para la medicacin en dermatologa: Por favor, guarde las cajas en las que vienen los medicamentos de uso tpico para ayudarle a seguir las instrucciones sobre dnde y cmo usarlos. Las farmacias generalmente imprimen las instrucciones del medicamento slo en las cajas y no directamente en los tubos del [place].   Si su medicamento es muy caro, por favor, pngase en contacto con landry rieger llamando al [phone] y presione la opcin 4 o envenos un mensaje a travs de Clinical Cytogeneticist.   No podemos decirle cul ser su copago por los medicamentos por adelantado ya que esto es diferente dependiendo de la cobertura de su seguro. Sin embargo, es posible que podamos encontrar un  medicamento sustituto a audiological scientist un formulario para que el seguro cubra el medicamento que se considera necesario.   Si se requiere una autorizacin previa para que su compaa de seguros cubra su medicamento, por favor permtanos de 1 a 2 das hbiles para completar este proceso.  Los precios de los medicamentos varan con frecuencia dependiendo del environmental consultant de dnde se surte la receta y alguna farmacias pueden ofrecer precios ms baratos.  El sitio web www.goodrx.com tiene cupones para medicamentos de health and safety inspector. Los precios aqu no tienen en cuenta lo que podra costar con la ayuda del seguro (puede ser ms  barato con su seguro), pero el sitio web puede darle el precio si no visual merchandiser.  - Puede imprimir el cupn correspondiente y llevarlo con su receta a la farmacia.  - Tambin puede pasar por nuestra oficina durante el horario de atencin regular y education officer, museum una tarjeta de cupones de GoodRx.  - Si necesita que su receta se enve electrnicamente a una farmacia diferente, informe a nuestra oficina a travs de MyChart de [place] o por telfono llamando al [phone] y presione la opcin 4.

## 2024-12-10 ENCOUNTER — Ambulatory Visit: Admitting: Dermatology

## 2025-04-28 ENCOUNTER — Other Ambulatory Visit

## 2025-05-05 ENCOUNTER — Encounter: Admitting: Family Medicine
